# Patient Record
Sex: Male | Born: 1950 | Race: White | Hispanic: No | Marital: Married | Smoking: Never smoker
Health system: Southern US, Community
[De-identification: ages and names within clinical notes are randomized; demographics above are authoritative.]

## PROBLEM LIST (undated history)

## (undated) DIAGNOSIS — Z21 Asymptomatic human immunodeficiency virus [HIV] infection status: Secondary | ICD-10-CM

## (undated) DIAGNOSIS — N179 Acute kidney failure, unspecified: Secondary | ICD-10-CM

## (undated) DIAGNOSIS — M25561 Pain in right knee: Secondary | ICD-10-CM

## (undated) DIAGNOSIS — F329 Major depressive disorder, single episode, unspecified: Secondary | ICD-10-CM

## (undated) DIAGNOSIS — I10 Essential (primary) hypertension: Secondary | ICD-10-CM

## (undated) DIAGNOSIS — H9190 Unspecified hearing loss, unspecified ear: Secondary | ICD-10-CM

## (undated) DIAGNOSIS — M25511 Pain in right shoulder: Secondary | ICD-10-CM

## (undated) DIAGNOSIS — R509 Fever, unspecified: Principal | ICD-10-CM

## (undated) DIAGNOSIS — F32A Depression, unspecified: Secondary | ICD-10-CM

## (undated) DIAGNOSIS — M25552 Pain in left hip: Secondary | ICD-10-CM

## (undated) DIAGNOSIS — M25551 Pain in right hip: Secondary | ICD-10-CM

## (undated) DIAGNOSIS — B20 Human immunodeficiency virus [HIV] disease: Secondary | ICD-10-CM

## (undated) DIAGNOSIS — G5793 Unspecified mononeuropathy of bilateral lower limbs: Secondary | ICD-10-CM

## (undated) DIAGNOSIS — M25512 Pain in left shoulder: Secondary | ICD-10-CM

## (undated) DIAGNOSIS — R200 Anesthesia of skin: Secondary | ICD-10-CM

## (undated) DIAGNOSIS — R202 Paresthesia of skin: Secondary | ICD-10-CM

## (undated) HISTORY — DX: Human immunodeficiency virus (HIV) disease: B20

## (undated) HISTORY — DX: Pain in left hip: M25.552

## (undated) HISTORY — DX: Anesthesia of skin: R20.0

## (undated) HISTORY — DX: Pain in right hip: M25.551

## (undated) HISTORY — DX: Unspecified mononeuropathy of bilateral lower limbs: G57.93

## (undated) HISTORY — DX: Pain in right knee: M25.561

## (undated) HISTORY — DX: Unspecified hearing loss, unspecified ear: H91.90

## (undated) HISTORY — DX: Fever, unspecified: R50.9

## (undated) HISTORY — DX: Paresthesia of skin: R20.2

## (undated) HISTORY — DX: Pain in left shoulder: M25.512

## (undated) HISTORY — DX: Essential (primary) hypertension: I10

## (undated) HISTORY — DX: Pain in right shoulder: M25.511

## (undated) HISTORY — DX: Asymptomatic human immunodeficiency virus (hiv) infection status: Z21

---

## 2005-12-06 ENCOUNTER — Encounter (INDEPENDENT_AMBULATORY_CARE_PROVIDER_SITE_OTHER): Payer: Self-pay | Admitting: *Deleted

## 2005-12-06 LAB — CONVERTED CEMR LAB
CD4 Count: 205 microliters
CD4 T Cell Abs: 205

## 2005-12-21 ENCOUNTER — Encounter (INDEPENDENT_AMBULATORY_CARE_PROVIDER_SITE_OTHER): Payer: Self-pay | Admitting: *Deleted

## 2005-12-21 ENCOUNTER — Ambulatory Visit: Payer: Self-pay | Admitting: Infectious Diseases

## 2005-12-21 ENCOUNTER — Encounter: Admission: RE | Admit: 2005-12-21 | Discharge: 2005-12-21 | Payer: Self-pay | Admitting: Infectious Diseases

## 2006-01-05 ENCOUNTER — Ambulatory Visit: Payer: Self-pay | Admitting: Infectious Diseases

## 2006-01-19 ENCOUNTER — Ambulatory Visit: Payer: Self-pay | Admitting: Infectious Diseases

## 2006-01-27 ENCOUNTER — Ambulatory Visit: Payer: Self-pay | Admitting: Infectious Diseases

## 2006-02-17 ENCOUNTER — Ambulatory Visit: Payer: Self-pay | Admitting: Infectious Diseases

## 2006-03-11 ENCOUNTER — Ambulatory Visit: Payer: Self-pay | Admitting: Internal Medicine

## 2006-03-22 ENCOUNTER — Ambulatory Visit: Payer: Self-pay | Admitting: Infectious Diseases

## 2006-04-26 ENCOUNTER — Ambulatory Visit: Payer: Self-pay | Admitting: Infectious Diseases

## 2006-05-18 ENCOUNTER — Encounter (INDEPENDENT_AMBULATORY_CARE_PROVIDER_SITE_OTHER): Payer: Self-pay | Admitting: *Deleted

## 2006-05-18 ENCOUNTER — Ambulatory Visit: Payer: Self-pay | Admitting: Infectious Diseases

## 2006-05-18 LAB — CONVERTED CEMR LAB: CD4 Count: 284 microliters

## 2006-06-07 ENCOUNTER — Encounter (INDEPENDENT_AMBULATORY_CARE_PROVIDER_SITE_OTHER): Payer: Self-pay | Admitting: *Deleted

## 2006-06-07 ENCOUNTER — Ambulatory Visit: Payer: Self-pay | Admitting: Infectious Diseases

## 2006-06-07 LAB — CONVERTED CEMR LAB: HIV 1 RNA Quant: 220 copies/mL

## 2006-07-11 ENCOUNTER — Ambulatory Visit: Payer: Self-pay | Admitting: Infectious Diseases

## 2006-07-11 ENCOUNTER — Encounter (INDEPENDENT_AMBULATORY_CARE_PROVIDER_SITE_OTHER): Payer: Self-pay | Admitting: *Deleted

## 2006-07-11 LAB — CONVERTED CEMR LAB
AST: 17 units/L (ref 0–37)
Alkaline Phosphatase: 65 units/L (ref 39–117)
BUN: 13 mg/dL (ref 6–23)
Bilirubin Urine: NEGATIVE
Bilirubin, Direct: 0.1 mg/dL (ref 0.0–0.3)
CD4 Count: 300 microliters
CO2: 22 meq/L (ref 19–32)
Chloride: 105 meq/L (ref 96–112)
Creatinine, Ser: 0.94 mg/dL (ref 0.40–1.50)
Glucose, Bld: 131 mg/dL — ABNORMAL HIGH (ref 70–99)
Indirect Bilirubin: 0.5 mg/dL (ref 0.0–0.9)
Ketones, ur: NEGATIVE mg/dL
Specific Gravity, Urine: 1.018 (ref 1.005–1.03)
Total Bilirubin: 0.6 mg/dL (ref 0.3–1.2)
Triglycerides: 474 mg/dL — ABNORMAL HIGH (ref <150)
Urine Glucose: NEGATIVE mg/dL
pH: 5.5 (ref 5.0–8.0)

## 2006-10-05 ENCOUNTER — Ambulatory Visit: Payer: Self-pay | Admitting: Infectious Diseases

## 2006-10-05 LAB — CONVERTED CEMR LAB
BUN: 13 mg/dL (ref 6–23)
Bilirubin, Direct: 0.1 mg/dL (ref 0.0–0.3)
CD4 Count: 416 microliters
Chloride: 106 meq/L (ref 96–112)
Direct LDL: 84 mg/dL
Glucose, Bld: 131 mg/dL — ABNORMAL HIGH (ref 70–99)
HIV 1 RNA Quant: 57 copies/mL
Indirect Bilirubin: 0.5 mg/dL (ref 0.0–0.9)
Potassium: 3.8 meq/L (ref 3.5–5.3)
Sodium: 138 meq/L (ref 135–145)
Total CHOL/HDL Ratio: 8.1
Total Protein: 8 g/dL (ref 6.0–8.3)

## 2006-10-08 DIAGNOSIS — B20 Human immunodeficiency virus [HIV] disease: Secondary | ICD-10-CM | POA: Insufficient documentation

## 2006-10-08 DIAGNOSIS — Z8619 Personal history of other infectious and parasitic diseases: Secondary | ICD-10-CM | POA: Insufficient documentation

## 2006-10-17 ENCOUNTER — Encounter (INDEPENDENT_AMBULATORY_CARE_PROVIDER_SITE_OTHER): Payer: Self-pay | Admitting: *Deleted

## 2006-10-17 LAB — CONVERTED CEMR LAB

## 2006-10-27 ENCOUNTER — Ambulatory Visit: Payer: Self-pay | Admitting: Infectious Diseases

## 2006-10-30 ENCOUNTER — Encounter (INDEPENDENT_AMBULATORY_CARE_PROVIDER_SITE_OTHER): Payer: Self-pay | Admitting: *Deleted

## 2007-01-04 ENCOUNTER — Ambulatory Visit: Payer: Self-pay | Admitting: Infectious Diseases

## 2007-01-04 LAB — CONVERTED CEMR LAB
ALT: 30 U/L
AST: 21 U/L
Albumin: 4.8 g/dL
Alkaline Phosphatase: 71 U/L
BUN: 14 mg/dL
Bilirubin Urine: NEGATIVE
Bilirubin, Direct: 0.1 mg/dL
CO2: 25 meq/L
Calcium: 9.4 mg/dL
Chloride: 103 meq/L
Cholesterol: 177 mg/dL
Creatinine, Ser: 1.1 mg/dL
Creatinine, Urine: 196.8 mg/dL
Direct LDL: 92 mg/dL
Glucose, Bld: 104 mg/dL — ABNORMAL HIGH
HDL: 28 mg/dL — ABNORMAL LOW
HIV 1 RNA Quant: 49 copies/mL
Hemoglobin, Urine: NEGATIVE
Indirect Bilirubin: 0.5 mg/dL
Ketones, ur: NEGATIVE mg/dL
Leukocytes, UA: NEGATIVE
Nitrite: NEGATIVE
Potassium: 4 meq/L
Protein, ur: NEGATIVE mg/dL
Sodium: 139 meq/L
Specific Gravity, Urine: 1.024
Total Bilirubin: 0.6 mg/dL
Total CHOL/HDL Ratio: 6.3
Total Protein, Urine: 24
Total Protein: 8 g/dL
Triglycerides: 533 mg/dL — ABNORMAL HIGH
Urine Glucose: NEGATIVE mg/dL
Urobilinogen, UA: 0.2
pH: 5.5

## 2007-01-30 ENCOUNTER — Telehealth (INDEPENDENT_AMBULATORY_CARE_PROVIDER_SITE_OTHER): Payer: Self-pay | Admitting: Infectious Diseases

## 2007-02-01 ENCOUNTER — Encounter (INDEPENDENT_AMBULATORY_CARE_PROVIDER_SITE_OTHER): Payer: Self-pay | Admitting: Infectious Diseases

## 2007-03-22 ENCOUNTER — Ambulatory Visit: Payer: Self-pay | Admitting: Infectious Diseases

## 2007-03-22 LAB — CONVERTED CEMR LAB
ALT: 26 units/L (ref 0–53)
AST: 19 units/L (ref 0–37)
Bilirubin, Direct: 0.1 mg/dL (ref 0.0–0.3)
Calcium: 9.5 mg/dL (ref 8.4–10.5)
Cholesterol: 156 mg/dL (ref 0–200)
Direct LDL: 94 mg/dL
Glucose, Bld: 83 mg/dL (ref 70–99)
Indirect Bilirubin: 0.5 mg/dL (ref 0.0–0.9)
Potassium: 3.9 meq/L (ref 3.5–5.3)
Sodium: 139 meq/L (ref 135–145)
Total CHOL/HDL Ratio: 7.1
Total Protein: 7.5 g/dL (ref 6.0–8.3)
Triglycerides: 552 mg/dL — ABNORMAL HIGH (ref <150)

## 2007-06-14 ENCOUNTER — Ambulatory Visit: Payer: Self-pay | Admitting: Infectious Diseases

## 2007-06-14 ENCOUNTER — Encounter: Payer: Self-pay | Admitting: Infectious Diseases

## 2007-06-14 LAB — CONVERTED CEMR LAB
Alkaline Phosphatase: 66 units/L (ref 39–117)
BUN: 12 mg/dL (ref 6–23)
Bilirubin, Direct: 0.1 mg/dL (ref 0.0–0.3)
CD4 Count: 498 microliters
Chloride: 104 meq/L (ref 96–112)
Creatinine, Ser: 1.04 mg/dL (ref 0.40–1.50)
Direct LDL: 73 mg/dL
Glucose, Bld: 119 mg/dL — ABNORMAL HIGH (ref 70–99)
HIV 1 RNA Quant: 49 copies/mL
Indirect Bilirubin: 0.6 mg/dL (ref 0.0–0.9)
Leukocytes, UA: NEGATIVE
Potassium: 4.4 meq/L (ref 3.5–5.3)
Protein, ur: NEGATIVE mg/dL
Specific Gravity, Urine: 1.019 (ref 1.005–1.03)
Urine Glucose: NEGATIVE mg/dL
pH: 5 (ref 5.0–8.0)

## 2007-09-06 ENCOUNTER — Ambulatory Visit: Payer: Self-pay | Admitting: Infectious Diseases

## 2007-09-06 LAB — CONVERTED CEMR LAB
ALT: 35 units/L (ref 0–53)
AST: 24 units/L (ref 0–37)
Bilirubin, Direct: 0.1 mg/dL (ref 0.0–0.3)
CD4 Count: 419 uL
Calcium: 9.6 mg/dL (ref 8.4–10.5)
Cholesterol: 167 mg/dL (ref 0–200)
Direct LDL: 85 mg/dL
Glucose, Bld: 167 mg/dL — ABNORMAL HIGH (ref 70–99)
HIV 1 RNA Quant: 49 {copies}/mL
Sodium: 142 meq/L (ref 135–145)
Total CHOL/HDL Ratio: 7
Triglycerides: 493 mg/dL — ABNORMAL HIGH (ref <150)

## 2007-12-05 ENCOUNTER — Ambulatory Visit: Payer: Self-pay | Admitting: Infectious Diseases

## 2007-12-05 LAB — CONVERTED CEMR LAB
BUN: 17 mg/dL (ref 6–23)
Bilirubin Urine: NEGATIVE
Bilirubin, Direct: 0.1 mg/dL (ref 0.0–0.3)
Chloride: 104 meq/L (ref 96–112)
Cholesterol: 181 mg/dL (ref 0–200)
Creatinine, Urine: 128.5 mg/dL
Glucose, Bld: 101 mg/dL — ABNORMAL HIGH (ref 70–99)
HCV Ab: NEGATIVE
Hep A Total Ab: NEGATIVE
Hep B Core Total Ab: POSITIVE — AB
Hepatitis B Surface Ag: NEGATIVE
Indirect Bilirubin: 0.4 mg/dL (ref 0.0–0.9)
Ketones, ur: NEGATIVE mg/dL
LDL Cholesterol: 88 mg/dL (ref 0–99)
Potassium: 4.1 meq/L (ref 3.5–5.3)
Protein, ur: NEGATIVE mg/dL
Urobilinogen, UA: 0.2 (ref 0.0–1.0)
VLDL: 56 mg/dL — ABNORMAL HIGH (ref 0–40)

## 2008-02-20 ENCOUNTER — Encounter: Payer: Self-pay | Admitting: Infectious Diseases

## 2008-02-20 ENCOUNTER — Ambulatory Visit: Payer: Self-pay | Admitting: Internal Medicine

## 2008-02-20 LAB — CONVERTED CEMR LAB
ALT: 25 units/L (ref 0–53)
AST: 16 units/L (ref 0–37)
Albumin: 4.8 g/dL (ref 3.5–5.2)
Alkaline Phosphatase: 59 units/L (ref 39–117)
BUN: 13 mg/dL (ref 6–23)
Bilirubin, Direct: 0.1 mg/dL (ref 0.0–0.3)
CD4 Count: 415 microliters
CO2: 25 meq/L (ref 19–32)
Calcium: 9.7 mg/dL (ref 8.4–10.5)
Chloride: 105 meq/L (ref 96–112)
Cholesterol: 152 mg/dL (ref 0–200)
Creatinine, Ser: 1.03 mg/dL (ref 0.40–1.50)
Glucose, Bld: 105 mg/dL — ABNORMAL HIGH (ref 70–99)
HDL: 25 mg/dL — ABNORMAL LOW (ref >39)
Indirect Bilirubin: 0.4 mg/dL (ref 0.0–0.9)
LDL Cholesterol: 56 mg/dL (ref 0–99)
Potassium: 4.3 meq/L (ref 3.5–5.3)
Sodium: 142 meq/L (ref 135–145)
Total Bilirubin: 0.5 mg/dL (ref 0.3–1.2)
Total CHOL/HDL Ratio: 6.1
Total Protein: 7.9 g/dL (ref 6.0–8.3)
Triglycerides: 353 mg/dL — ABNORMAL HIGH (ref <150)
VLDL: 71 mg/dL — ABNORMAL HIGH (ref 0–40)

## 2008-05-10 ENCOUNTER — Encounter: Payer: Self-pay | Admitting: Infectious Diseases

## 2008-05-10 ENCOUNTER — Ambulatory Visit: Payer: Self-pay | Admitting: Infectious Disease

## 2008-05-10 LAB — CONVERTED CEMR LAB
ALT: 23 units/L (ref 0–53)
AST: 17 units/L (ref 0–37)
Albumin: 4.7 g/dL (ref 3.5–5.2)
Bilirubin, Direct: 0.1 mg/dL (ref 0.0–0.3)
Cholesterol: 167 mg/dL (ref 0–200)
Glucose, Bld: 86 mg/dL (ref 70–99)
HDL: 26 mg/dL — ABNORMAL LOW (ref >39)
HIV 1 RNA Quant: 49 copies/mL
Hemoglobin, Urine: NEGATIVE
Ketones, ur: NEGATIVE mg/dL
Leukocytes, UA: NEGATIVE
Nitrite: NEGATIVE
Phosphorus: 3.4 mg/dL (ref 2.3–4.6)
Potassium: 3.7 meq/L (ref 3.5–5.3)
Protein, ur: 30 mg/dL — AB
Sodium: 142 meq/L (ref 135–145)
Total CHOL/HDL Ratio: 6.4
Total Protein: 7.2 g/dL (ref 6.0–8.3)
Triglycerides: 433 mg/dL — ABNORMAL HIGH (ref <150)
Urobilinogen, UA: 0.2 (ref 0.0–1.0)

## 2008-06-04 ENCOUNTER — Encounter: Payer: Self-pay | Admitting: Infectious Diseases

## 2008-09-04 ENCOUNTER — Ambulatory Visit: Payer: Self-pay | Admitting: Infectious Diseases

## 2008-09-04 ENCOUNTER — Encounter: Payer: Self-pay | Admitting: Infectious Disease

## 2008-09-04 LAB — CONVERTED CEMR LAB
ALT: 43 units/L (ref 0–53)
Albumin: 4.7 g/dL (ref 3.5–5.2)
CD4 Count: 439 microliters
CO2: 20 meq/L (ref 19–32)
Cholesterol: 175 mg/dL (ref 0–200)
Glucose, Bld: 117 mg/dL — ABNORMAL HIGH (ref 70–99)
LDL Cholesterol: 73 mg/dL (ref 0–99)
Potassium: 3.8 meq/L (ref 3.5–5.3)
Sodium: 142 meq/L (ref 135–145)
Total Protein, Urine: 26
Total Protein: 7.8 g/dL (ref 6.0–8.3)
Triglycerides: 361 mg/dL — ABNORMAL HIGH (ref <150)
VLDL: 72 mg/dL — ABNORMAL HIGH (ref 0–40)

## 2008-09-19 ENCOUNTER — Ambulatory Visit: Payer: Self-pay | Admitting: Infectious Disease

## 2008-09-19 LAB — CONVERTED CEMR LAB
Chlamydia, Swab/Urine, PCR: NEGATIVE
GC Probe Amp, Urine: NEGATIVE

## 2008-12-30 ENCOUNTER — Ambulatory Visit: Payer: Self-pay | Admitting: Infectious Disease

## 2008-12-30 LAB — CONVERTED CEMR LAB
Albumin: 5.1 g/dL (ref 3.5–5.2)
Alkaline Phosphatase: 75 units/L (ref 39–117)
BUN: 12 mg/dL (ref 6–23)
Basophils Absolute: 0 10*3/uL (ref 0.0–0.1)
CO2: 24 meq/L (ref 19–32)
Calcium: 9.6 mg/dL (ref 8.4–10.5)
Cholesterol, target level: 200 mg/dL
Cholesterol: 131 mg/dL (ref 0–200)
Eosinophils Relative: 1 % (ref 0–5)
GFR calc Af Amer: >60 mL/min (ref >60)
GFR calc non Af Amer: >60 mL/min (ref >60)
Glucose, Bld: 104 mg/dL — ABNORMAL HIGH (ref 70–99)
HDL: 24 mg/dL — ABNORMAL LOW (ref >39)
HIV-1 RNA Quant, Log: <1.68 (ref <1.68)
LDL Cholesterol: 65 mg/dL (ref 0–99)
Lymphocytes Relative: 22 % (ref 12–46)
Lymphs Abs: 1.2 10*3/uL (ref 0.7–4.0)
Neutro Abs: 3.7 10*3/uL (ref 1.7–7.7)
Neutrophils Relative %: 67 % (ref 43–77)
Platelets: 187 10*3/uL (ref 150–400)
Potassium: 4.4 meq/L (ref 3.5–5.3)
RDW: 13.1 % (ref 11.5–15.5)
Sodium: 140 meq/L (ref 135–145)
Total Protein: 8 g/dL (ref 6.0–8.3)
Triglycerides: 209 mg/dL — ABNORMAL HIGH (ref <150)
WBC: 5.5 10*3/uL (ref 4.0–10.5)

## 2009-02-04 ENCOUNTER — Ambulatory Visit: Payer: Self-pay | Admitting: Infectious Disease

## 2009-02-04 LAB — CONVERTED CEMR LAB
ALT: 61 units/L — ABNORMAL HIGH (ref 0–53)
Albumin: 4.6 g/dL (ref 3.5–5.2)
CD4 Count: 375 microliters
CO2: 24 meq/L (ref 19–32)
GFR calc Af Amer: >60 mL/min (ref >60)
HDL: 23 mg/dL — ABNORMAL LOW (ref >39)
HIV 1 RNA Quant: 39 copies/mL
LDL Cholesterol: 48 mg/dL (ref 0–99)
Potassium: 4.7 meq/L (ref 3.5–5.3)
Sodium: 140 meq/L (ref 135–145)
Total Bilirubin: 0.5 mg/dL (ref 0.3–1.2)
Total CHOL/HDL Ratio: 5.5
Total Protein: 7.4 g/dL (ref 6.0–8.3)

## 2009-06-10 ENCOUNTER — Ambulatory Visit: Payer: Self-pay | Admitting: Infectious Disease

## 2009-06-10 LAB — CONVERTED CEMR LAB
ALT: 75 units/L — ABNORMAL HIGH (ref 0–53)
AST: 40 units/L — ABNORMAL HIGH (ref 0–37)
Alkaline Phosphatase: 67 units/L (ref 39–117)
Basophils Absolute: 0 10*3/uL (ref 0.0–0.1)
Basophils Relative: 0 % (ref 0–1)
LDL Cholesterol: 68 mg/dL (ref 0–99)
MCHC: 32.9 g/dL (ref 30.0–36.0)
Neutro Abs: 3.4 10*3/uL (ref 1.7–7.7)
Neutrophils Relative %: 63 % (ref 43–77)
RBC: 5.17 M/uL (ref 4.22–5.81)
RDW: 13.9 % (ref 11.5–15.5)
Sodium: 142 meq/L (ref 135–145)
Total Bilirubin: 0.7 mg/dL (ref 0.3–1.2)
Total Protein: 7.6 g/dL (ref 6.0–8.3)
Triglycerides: 144 mg/dL (ref <150)
VLDL: 29 mg/dL (ref 0–40)

## 2009-09-24 ENCOUNTER — Ambulatory Visit: Payer: Self-pay | Admitting: Infectious Disease

## 2009-09-24 LAB — CONVERTED CEMR LAB
ALT: 68 units/L — ABNORMAL HIGH (ref 0–53)
AST: 36 units/L (ref 0–37)
Calcium: 9.2 mg/dL (ref 8.4–10.5)
Chloride: 103 meq/L (ref 96–112)
Creatinine, Ser: 1.05 mg/dL (ref 0.40–1.50)
Creatinine, Urine: 155.4 mg/dL
Hep B Core Total Ab: POSITIVE — AB
Hepatitis B Surface Ag: NEGATIVE
Potassium: 4.3 meq/L (ref 3.5–5.3)
Total CHOL/HDL Ratio: 4.4

## 2009-10-11 ENCOUNTER — Encounter: Payer: Self-pay | Admitting: Infectious Disease

## 2010-01-06 ENCOUNTER — Ambulatory Visit: Payer: Self-pay | Admitting: Infectious Disease

## 2010-01-06 LAB — CONVERTED CEMR LAB
Cholesterol: 111 mg/dL (ref 0–200)
Total CHOL/HDL Ratio: 4.6
Triglycerides: 175 mg/dL — ABNORMAL HIGH (ref <150)
VLDL: 35 mg/dL (ref 0–40)

## 2010-01-07 ENCOUNTER — Encounter: Payer: Self-pay | Admitting: Infectious Disease

## 2010-01-07 LAB — CONVERTED CEMR LAB
AST: 36 units/L (ref 0–37)
Alkaline Phosphatase: 67 units/L (ref 39–117)
BUN: 12 mg/dL (ref 6–23)
Creatinine, Ser: 1.01 mg/dL (ref 0.40–1.50)
Potassium: 4 meq/L (ref 3.5–5.3)
Total Bilirubin: 0.5 mg/dL (ref 0.3–1.2)

## 2010-03-11 ENCOUNTER — Telehealth (INDEPENDENT_AMBULATORY_CARE_PROVIDER_SITE_OTHER): Payer: Self-pay | Admitting: *Deleted

## 2010-05-12 ENCOUNTER — Ambulatory Visit: Payer: Self-pay | Admitting: Infectious Disease

## 2010-05-13 ENCOUNTER — Encounter: Payer: Self-pay | Admitting: Infectious Disease

## 2010-05-13 LAB — CONVERTED CEMR LAB
Albumin: 4.4 g/dL (ref 3.5–5.2)
Alkaline Phosphatase: 60 units/L (ref 39–117)
BUN: 16 mg/dL (ref 6–23)
CO2: 23 meq/L (ref 19–32)
Cholesterol: 125 mg/dL (ref 0–200)
Eosinophils Absolute: 0.1 10*3/uL (ref 0.0–0.7)
Eosinophils Relative: 2 % (ref 0–5)
Glucose, Bld: 107 mg/dL — ABNORMAL HIGH (ref 70–99)
HCT: 47.1 % (ref 39.0–52.0)
HDL: 22 mg/dL — ABNORMAL LOW (ref >39)
Hemoglobin: 15.4 g/dL (ref 13.0–17.0)
LDL Cholesterol: 69 mg/dL (ref 0–99)
Lymphs Abs: 1.5 10*3/uL (ref 0.7–4.0)
MCV: 98.1 fL (ref 78.0–100.0)
Monocytes Relative: 7 % (ref 3–12)
Potassium: 4.3 meq/L (ref 3.5–5.3)
RBC: 4.8 M/uL (ref 4.22–5.81)
Total Bilirubin: 0.8 mg/dL (ref 0.3–1.2)
Triglycerides: 168 mg/dL — ABNORMAL HIGH (ref <150)
WBC: 5.6 10*3/uL (ref 4.0–10.5)

## 2010-07-21 ENCOUNTER — Encounter (INDEPENDENT_AMBULATORY_CARE_PROVIDER_SITE_OTHER): Payer: Self-pay | Admitting: *Deleted

## 2010-08-05 ENCOUNTER — Ambulatory Visit: Payer: Self-pay | Admitting: Infectious Disease

## 2010-08-05 ENCOUNTER — Encounter: Payer: Self-pay | Admitting: Infectious Disease

## 2010-08-05 LAB — CONVERTED CEMR LAB
Chlamydia, Swab/Urine, PCR: NEGATIVE
GC Probe Amp, Urine: NEGATIVE

## 2010-09-15 ENCOUNTER — Ambulatory Visit: Admit: 2010-09-15 | Payer: Self-pay | Admitting: Infectious Disease

## 2010-09-16 ENCOUNTER — Encounter (INDEPENDENT_AMBULATORY_CARE_PROVIDER_SITE_OTHER): Payer: Self-pay | Admitting: *Deleted

## 2010-09-22 NOTE — Assessment & Plan Note (Signed)
Summary: STUDY APPT/ LH    Current Allergies: No known allergies  Vital Signs:  Patient profile:   60 year old male Weight:      197.3 pounds BMI:     30.11 Temp:     98.4 degrees F oral Pulse rate:   96 / minute BP sitting:   153 / 96  (right arm) Is Patient Diabetic? No Pain Assessment Patient in pain? no      Nutritional Status BMI of > 30 = obese  Does patient need assistance? Functional Status Self care Ambulation Normal    Patient here for week 208 ALLRT study visit. He denies any new problems. He says he has been remembering his dreams more and wondered if that had anything to do with his meds. Also, He is concerned about weight gain. BP elevated. He refuses to start BP meds for now, though.Deirdre Evener RN  Jan 06, 2010 11:59 AM   Other Orders: Est. Patient Research Study [phone]) T-Comprehensive Metabolic Panel [id number]) T-Lipid Profile [id number])  Process Orders Check Orders Results:     [facility]: ABN not required for this insurance Tests Sent for requisitioning (Jan 06, 2010 11:57 AM):     01/06/2010: [facility] -- T-Comprehensive Metabolic Panel [[id number]] (signed)     01/06/2010: [facility] -- T-Lipid Profile [id number] (signed)

## 2010-09-22 NOTE — Assessment & Plan Note (Signed)
Summary: STUDY APPT/ LH    Current Allergies: No known allergies  Social History: Tobacco Use:  yes  Vital Signs:  Patient profile:   60 year old male Height:      68 inches (172.72 cm) Weight:      190.2 pounds (86.45 kg) BMI:     29.02 Temp:     97.9 degrees F oral Pulse rate:   88 / minute BP sitting:   150 / 88  (right arm) Is Patient Diabetic? No Research Study Name: ALLRT Pain Assessment Patient in pain? no      Nutritional Status BMI of 25 - 29 = overweight  Does patient need assistance? Functional Status Self care Ambulation Normal   Patient here for week 192 ALLRT study visit. He does not have any new complaints. He recently traveled to [place] for training for a new job. He has never started the lisinopril for BP and his BP was a little elevated today. He says that it is usually lower when checked outside of the clinic. He was instructed to schedule an appt with Dr. Daiva Eves soon.Deirdre Evener RN  September 24, 2009 9:39 AM    Complete Medication List: 1)  Isentress 400 Mg Tabs (Raltegravir potassium) .... Take 1 tablet by mouth two times a day 2)  Truvada 200-300 Mg Tabs (Emtricitabine-tenofovir) .... Take 1 tablet by mouth once a day 3)  Lisinopril 20 Mg Tabs (Lisinopril) .... Take 1 tablet by mouth once a day  Other Orders: Est. Patient Research Study [phone] [id number]) T-Comprehensive Metabolic Panel [id number]) T-Lipid Profile [id number]) T-Urine Protein [id number]) T-Urine Creatinine [id number]) T-Hepatitis A Antibody ([id number]) T-Hepatitis C Antibody ([id number]) T-Hepatitis B Surface Antigen ([id number]) T-Hepatitis B Core Antibody ([id number]) Process Orders Check Orders Results:     [facility]: ABN not required for this insurance Tests Sent for requisitioning (September 25, 2009 9:57 AM):     09/24/2009: [facility] -- T-Comprehensive Metabolic Panel [[id number]] (signed)     09/24/2009: [facility] -- T-Lipid Profile [phone] (signed)     09/24/2009: [facility] -- T-Urine Protein [id number] (signed)     09/24/2009: [facility] -- T-Urine Creatinine [[id number]] (signed)     09/24/2009: [facility] -- T-Hepatitis A Antibody [[id number]] (signed)     09/24/2009: [facility] -- T-Hepatitis C Antibody [[id number]] (signed)     09/24/2009: [facility] -- T-Hepatitis B Surface Antigen [[id number]] (signed)     09/24/2009: [facility] -- T-Hepatitis B Core Antibody [[id number]] (signed)

## 2010-09-22 NOTE — Assessment & Plan Note (Signed)
Summary: FLU SHOT  Prior Medications: ISENTRESS 400 MG TABS (RALTEGRAVIR POTASSIUM) Take 1 tablet by mouth two times a day TRUVADA 200-300 MG TABS (EMTRICITABINE-TENOFOVIR) Take 1 tablet by mouth once a day LISINOPRIL 20 MG TABS (LISINOPRIL) Take 1 tablet by mouth once a day Current Allergies: No known allergies  Immunizations Administered:  Influenza Vaccine # 1:    Vaccine Type: Fluvax Non-MCR    Site: left deltoid    Mfr: novartis    Dose: 0.5 ml    Route: IM    Given by: Deirdre Evener RN    Exp. Date: 12/06/2010    Lot #: [id number]    VIS given: 03/17/10 version given May 12, 2010.  Flu Vaccine Consent Questions:    Do you have a history of severe allergic reactions to this vaccine? no    Any prior history of allergic reactions to egg and/or gelatin? no    Do you have a sensitivity to the preservative Thimersol? no    Do you have a past history of Guillan-Barre Syndrome? no    Do you currently have an acute febrile illness? no    Have you ever had a severe reaction to latex? no    Vaccine information given and explained to patient? yes  Orders Added: 1)  Influenza Vaccine NON MCR [00028]

## 2010-09-22 NOTE — Miscellaneous (Signed)
Summary: HIV-1 RNA, CD4 (RESEARCH)  Clinical Lists Changes  Observations: Added new observation of CD4 COUNT: 454 microliters (09/24/2009 8:23) Added new observation of HIV1RNA QA: 39 copies/mL (09/24/2009 8:23)

## 2010-09-22 NOTE — Miscellaneous (Signed)
Summary: RW Update  Clinical Lists Changes  Observations: Added new observation of YEARAIDSPOS: 2010  (10/11/2009 10:30) Added new observation of HIV STATUS: CDC-defined AIDS  (10/11/2009 10:30)

## 2010-09-22 NOTE — Miscellaneous (Signed)
Summary: HIV-1 RNA, CD4 (RESEARCH)  Clinical Lists Changes  Observations: Added new observation of CD4 COUNT: 453 microliters (01/06/2010 15:04) Added new observation of HIV1RNA QA: 39 copies/mL (01/06/2010 15:04)

## 2010-09-22 NOTE — Progress Notes (Signed)
Summary: new scripts/New pharmacy [facility]  Phone Note From Pharmacy   Caller: [facility] Details for Reason: Need new prescriptions Summary of Call: Patient has insurance and [facility] is calling for new scripts.  Their phone number for future refills is [phone] Initial call taken by: Paulo Fruit  BS,CPht II,MPH,  March 11, 2010 2:41 PM    Prescriptions: TRUVADA 200-300 MG TABS (EMTRICITABINE-TENOFOVIR) Take 1 tablet by mouth once a day  #90 x 3   Entered by:   Paulo Fruit  BS,CPht II,MPH   Authorized by:   Acey Lav MD   Signed by:   Paulo Fruit  BS,CPht II,MPH on 03/11/2010   Method used:   Telephoned to ...         RxID:   [id number] ISENTRESS 400 MG TABS (RALTEGRAVIR POTASSIUM) Take 1 tablet by mouth two times a day  #60 x 12   Entered by:   Paulo Fruit  BS,CPht II,MPH   Authorized by:   Acey Lav MD   Signed by:   Paulo Fruit  BS,CPht II,MPH on 03/11/2010   Method used:   Telephoned to ...         RxID:   [id number]  spoke to the pharmacist Morrie Sheldon who read back the order.[facility] [phone] Paulo Fruit  BS,CPht II,MPH  March 11, 2010 2:44 PM

## 2010-09-22 NOTE — Miscellaneous (Signed)
  Clinical Lists Changes  Observations: Added new observation of YEARAIDSPOS: 2007  (07/21/2010 11:29)

## 2010-09-24 NOTE — Assessment & Plan Note (Signed)
Summary: F/U [MKJ]   Visit Type:  Follow-up Primary Provider:  Paulette Blanch Dam MD  CC:  f/u ov   No missed doses of HART , Hypertension Management, and Lipid Management.  History of Present Illness: 60 year old patient previously in ACTG on epzicom and boosted lexiva with suppressed viral load and t cell count in 300-400 range whom I changed to raltegravir and truvada.  He has remained supressed on this regimen and is being followed by Selena Batten in ALLERT ACTG study. He suffered another bout of bronchitis which is improving on cipro rx by his PCP. He is nonsmoker and has bout approximately once a year. Never tried bronchodilators. He was exposed to passive smoke from [place]. He has elevated BP today but no ssx. We discussed anti HTNsive meds and he was actually not taking his ACEI. He has better bps at his PCP but still in the 140s.  Hypertension History:      Positive major cardiovascular risk factors include male age 38 years old or older, hyperlipidemia, and hypertension.  Negative major cardiovascular risk factors include negative family history for ischemic heart disease and non-tobacco-user status.        Further assessment for target organ damage reveals no history of ASHD, stroke/TIA, or peripheral vascular disease.    Lipid Management History:      Positive NCEP/ATP III risk factors include male age 3 years old or older, HDL cholesterol less than 40, and hypertension.  Negative NCEP/ATP III risk factors include no family history for ischemic heart disease, non-tobacco-user status, no ASHD (atherosclerotic heart disease), no prior stroke/TIA, no peripheral vascular disease, and no history of aortic aneurysm.     Problems Prior to Update: 1)  Cough  (ICD-786.2) 2)  Essential Hypertension, Benign  (ICD-401.1) 3)  Health Screening  (ICD-V70.0) 4)  Hypertriglyceridemia  (ICD-272.1) 5)  HIV Disease  (ICD-042) 6)  Hepatitis B, Hx of  (ICD-V12.09)  Medications Prior to Update: 1)  Isentress  400 Mg Tabs (Raltegravir Potassium) .... Take 1 Tablet By Mouth Two Times A Day 2)  Truvada 200-300 Mg Tabs (Emtricitabine-Tenofovir) .... Take 1 Tablet By Mouth Once A Day 3)  Lisinopril 20 Mg Tabs (Lisinopril) .... Take 1 Tablet By Mouth Once A Day  Current Medications (verified): 1)  Isentress 400 Mg Tabs (Raltegravir Potassium) .... Take 1 Tablet By Mouth Two Times A Day 2)  Truvada 200-300 Mg Tabs (Emtricitabine-Tenofovir) .... Take 1 Tablet By Mouth Once A Day 3)  Hydrochlorothiazide 12.5 Mg Caps (Hydrochlorothiazide) .... Take 1 Tablet By Mouth Once A Day  Allergies (verified): 1)  ! Ceclor    Preventive Screening-Counseling & Management  Alcohol-Tobacco     Alcohol drinks/day: 0     Smoking Status: never     Cans of tobacco/week: yes     Passive Smoke Exposure: no   Current Allergies (reviewed today): ! CECLOR Past History:  Past Medical History: Hepatitis B, hx of HIV disease Bronchitis seasonally  Past Surgical History: none  Family History: no early coronary artery disease, no colon cancer, Mom died of lung cancer at 75  Review of Systems       The patient complains of prolonged cough.  The patient denies anorexia, fever, weight loss, weight gain, vision loss, decreased hearing, hoarseness, chest pain, syncope, dyspnea on exertion, peripheral edema, headaches, hemoptysis, abdominal pain, melena, hematochezia, severe indigestion/heartburn, hematuria, incontinence, genital sores, muscle weakness, suspicious skin lesions, transient blindness, difficulty walking, depression, unusual weight change, abnormal bleeding, enlarged lymph nodes,  and angioedema.    Vital Signs:  Patient profile:   60 year old male Height:      68 inches Weight:      191.8 pounds BMI:     29.27 BSA:     2.01 Temp:     98.3 degrees F oral Pulse rate:   85 / minute BP sitting:   160 / 88  (right arm) CC: f/u ov   No missed doses of HART , Hypertension Management, Lipid Management Is  Patient Diabetic? No Pain Assessment Patient in pain? no      Nutritional Status BMI of 25 - 29 = overweight Nutritional Status Detail normal  Have you ever been in a relationship where you felt threatened, hurt or afraid?No  Domestic Violence Intervention none  Does patient need assistance? Functional Status Self care Ambulation Normal        Medication Adherence: 08/05/2010   Adherence to medications reviewed with patient. Counseling to provide adequate adherence provided   Prevention For Positives: 08/05/2010   Safe sex practices discussed with patient. Condoms offered.                             Physical Exam  General:  alert, well-developed, and well-nourished.   Head:  normocephalic and atraumatic.   Eyes:  vision grossly intact, pupils equal, pupils round, and pupils reactive to light.   Ears:  no external deformities.   Nose:  no external deformity.   Mouth:  pharynx pink and moist with slight posterior erythema Lungs:  normal respiratory effort, no intercostal retractions, no accessory muscle use, and normal breath sounds.   Heart:  normal rate, regular rhythm, no murmur, and no gallop.   Abdomen:  soft, non-tender, normal bowel sounds, and no distention.   Msk:  normal ROM.   Extremities:  trace left pedal edema and trace right pedal edema.   Neurologic:  alert & oriented X3.  gait normal.   Skin:  color normal and no rashes.   Psych:  Oriented X3, memory intact for recent and remote, and normally interactive.     Impression & Recommendations:  Problem # 1:  HIV DISEASE (ICD-042)  Superb control. seeing Selena Batten in January  for labs will rtc in one year Diagnostics Reviewed:  HIV: CDC-defined AIDS (10/11/2009)   CD4: 453 (01/06/2010)   CD4 %: 26 (10/05/2006) WBC: 5.6 (05/13/2010)   Hgb: 15.4 (05/13/2010)   HCT: 47.1 (05/13/2010)   Platelets: 158 (05/13/2010) HIV-1 RNA: 39 (01/06/2010)   HBSAg: NEG (09/24/2009)  Orders: Est. Patient Level IV  (04540)  Problem # 2:  COUGH (ICD-786.2)  bronchitis flare resolving, can trial beta agonist in future  Orders: Est. Patient Level IV (98119)  Problem # 3:  ESSENTIAL HYPERTENSION, BENIGN (ICD-401.1)  add hctz The following medications were removed from the medication list:    Lisinopril 20 Mg Tabs (Lisinopril) .Marland Kitchen... Take 1 tablet by mouth once a day His updated medication list for this problem includes:    Hydrochlorothiazide 12.5 Mg Caps (Hydrochlorothiazide) .Marland Kitchen... Take 1 tablet by mouth once a day  Orders: Est. Patient Level IV (14782)  Problem # 4:  HYPERTRIGLYCERIDEMIA (ICD-272.1)  TG fine with change in meds, his hdl still low he iws working on weight Labs Reviewed: SGOT: 41 (05/13/2010)   SGPT: 66 (05/13/2010)  Lipid Goals: Chol Goal: 200 (12/30/2008)   HDL Goal: 40 (12/30/2008)   LDL Goal: 130 (12/30/2008)   TG Goal: 150 (  12/30/2008)  10 Yr Risk Heart Disease: 14 % Prior 10 Yr Risk Heart Disease: 11 % (12/30/2008)   HDL:22 (05/13/2010), 24 (01/06/2010)  LDL:69 (05/13/2010), 52 (01/06/2010)  Chol:125 (05/13/2010), 111 (01/06/2010)  Trig:168 (05/13/2010), 175 (01/06/2010)  Orders: Est. Patient Level IV (16109)  Medications Added to Medication List This Visit: 1)  Hydrochlorothiazide 12.5 Mg Caps (Hydrochlorothiazide) .... Take 1 tablet by mouth once a day  Other Orders: T-GC Probe, urine [id number]) T-Chlamydia  Probe, urine [id number]) Future Orders: T-RPR (Syphilis) ([id number]) ... 08/24/2010  Hypertension Assessment/Plan:      The patient's hypertensive risk group is category B: At least one risk factor (excluding diabetes) with no target organ damage.  His calculated 10 year risk of coronary heart disease is 14 %.  Today's blood pressure is 160/88.  His blood pressure goal is < 140/90.  Lipid Assessment/Plan:      Based on NCEP/ATP III, the patient's risk factor category is "2 or more risk factors and a calculated 10 year CAD risk of < 20%".  The  patient's lipid goals are as follows: Total cholesterol goal is 200; LDL cholesterol goal is 130; HDL cholesterol goal is 40; Triglyceride goal is 150.  His LDL cholesterol goal has been met.     Patient Instructions: 1)  rtc to see Dr Daiva Eves in one year    Hepatitis A Vaccine # 2 (to be given today) Prescriptions: HYDROCHLOROTHIAZIDE 12.5 MG CAPS (HYDROCHLOROTHIAZIDE) Take 1 tablet by mouth once a day  #30 x 11   Entered and Authorized by:   Acey Lav MD   Signed by:   Paulette Blanch Dam MD on 08/05/2010   Method used:   Electronically to        [facility] (retail)       [address]       [place], [place]  [address]       Ph: [phone]       Fax: [phone]   RxID:   [id number]

## 2010-09-24 NOTE — Miscellaneous (Signed)
  Clinical Lists Changes  Observations: Added new observation of INCOMESOURCE: UNKNOWN (09/16/2010 13:08) Added new observation of HOUSEINCOME: 0  (09/16/2010 13:08) Added new observation of HOUSING: Unknown  (09/16/2010 13:08)

## 2010-09-25 ENCOUNTER — Encounter: Payer: Self-pay | Admitting: Infectious Disease

## 2010-09-25 ENCOUNTER — Ambulatory Visit: Payer: Self-pay

## 2010-09-25 DIAGNOSIS — B20 Human immunodeficiency virus [HIV] disease: Secondary | ICD-10-CM

## 2010-09-25 LAB — CONVERTED CEMR LAB
ALT: 67 units/L — ABNORMAL HIGH (ref 0–53)
AST: 39 units/L — ABNORMAL HIGH (ref 0–37)
Calcium: 9.9 mg/dL (ref 8.4–10.5)
Chloride: 99 meq/L (ref 96–112)
Creatinine, Ser: 1.11 mg/dL (ref 0.40–1.50)
Creatinine, Urine: 191.7 mg/dL
HIV 1 RNA Quant: 39 copies/mL
Sodium: 139 meq/L (ref 135–145)
Total Bilirubin: 0.7 mg/dL (ref 0.3–1.2)
Total CHOL/HDL Ratio: 5.1
Total Protein, Urine: 16
Total Protein: 7.8 g/dL (ref 6.0–8.3)
VLDL: 34 mg/dL (ref 0–40)

## 2010-09-30 NOTE — Assessment & Plan Note (Signed)
Summary: Research   Vital Signs:  Patient profile:   60 year old male Weight:      193.75 pounds (88.07 kg) BMI:     29.57 Temp:     97.8 degrees F oral Pulse rate:   73 / minute BP sitting:   145 / 88  (right arm) Is Patient Diabetic? No Pain Assessment Patient in pain? no      Nutritional Status BMI of 25 - 29 = overweight  Does patient need assistance? Functional Status Self care Ambulation Normal   Allergies: 1)  ! Ceclor  Patient here for week 240 ALLRT study visit. He denies any current complaints. He will return in June for his next visit.Deirdre Evener RN  September 25, 2010 9:01 AM    Other Orders: Est. Patient Research Study [id number]) T-Comprehensive Metabolic Panel [id number]) T-Lipid Profile [id number]) T-Urine Protein [phone]) T-Urine Creatinine [phone])   Orders Added: 1)  Est. Patient Research Study [04200] 2)  T-Comprehensive Metabolic Panel [[id number]] 3)  T-Lipid Profile [[id number]] 4)  T-Urine Protein [[id number]] 5)  T-Urine Creatinine [[id number]]

## 2010-10-14 NOTE — Miscellaneous (Signed)
Summary: HIV-1 RNA, CD4 (RESEARCH)  Clinical Lists Changes  Observations: Added new observation of CD4 COUNT: 537 microliters (09/25/2010 10:47) Added new observation of HIV1RNA QA: 39 copies/mL (09/25/2010 10:47)

## 2011-01-22 ENCOUNTER — Ambulatory Visit (INDEPENDENT_AMBULATORY_CARE_PROVIDER_SITE_OTHER): Payer: Self-pay | Admitting: *Deleted

## 2011-01-22 ENCOUNTER — Encounter: Payer: Self-pay | Admitting: *Deleted

## 2011-01-22 VITALS — BP 163/100 | HR 96 | Temp 98.1°F | Wt 189.8 lb

## 2011-01-22 DIAGNOSIS — B20 Human immunodeficiency virus [HIV] disease: Secondary | ICD-10-CM

## 2011-01-22 LAB — COMPREHENSIVE METABOLIC PANEL
AST: 47 U/L — ABNORMAL HIGH (ref 0–37)
Albumin: 4.9 g/dL (ref 3.5–5.2)
Alkaline Phosphatase: 64 U/L (ref 39–117)
BUN: 15 mg/dL (ref 6–23)
Creat: 1.11 mg/dL (ref 0.50–1.35)
Glucose, Bld: 116 mg/dL — ABNORMAL HIGH (ref 70–99)
Total Bilirubin: 0.7 mg/dL (ref 0.3–1.2)

## 2011-01-22 LAB — LIPID PANEL
HDL: 22 mg/dL — ABNORMAL LOW (ref >39)
LDL Cholesterol: 67 mg/dL (ref 0–99)
Total CHOL/HDL Ratio: 5.4 Ratio
Triglycerides: 148 mg/dL (ref <150)

## 2011-01-22 NOTE — Progress Notes (Signed)
Patient here for week 256 ALLRT study visit. BP today is 163/100. He had not taken his BP med yet for today (HCTZ) He plans on seeing his primary MD for followup. He was recently treated for rosacea by dermatology. He will return in October for his next visit for study.

## 2011-01-22 NOTE — Progress Notes (Signed)
Addended by: Armida Sans on: 01/22/2011 01:32 PM   Modules accepted: Orders

## 2011-02-18 ENCOUNTER — Encounter: Payer: Self-pay | Admitting: Infectious Disease

## 2011-02-18 LAB — CD4/CD8 (T-HELPER/T-SUPPRESSOR CELL)
CD4: 575
CD8 % Suppressor T Cell: 30.5

## 2011-03-25 ENCOUNTER — Other Ambulatory Visit: Payer: Self-pay | Admitting: *Deleted

## 2011-03-25 DIAGNOSIS — B20 Human immunodeficiency virus [HIV] disease: Secondary | ICD-10-CM

## 2011-03-25 MED ORDER — RALTEGRAVIR POTASSIUM 400 MG PO TABS: 400.0000 mg | ORAL_TABLET | Freq: Two times a day (BID) | ORAL | Status: DC

## 2011-03-25 MED ORDER — EMTRICITABINE-TENOFOVIR DF 200-300 MG PO TABS: 1.0000 | ORAL_TABLET | Freq: Every day | ORAL | Status: DC

## 2011-03-25 NOTE — Telephone Encounter (Signed)
Called into [facility] for 11 refills per patient request.

## 2011-07-02 ENCOUNTER — Ambulatory Visit (INDEPENDENT_AMBULATORY_CARE_PROVIDER_SITE_OTHER): Payer: Self-pay | Admitting: *Deleted

## 2011-07-02 DIAGNOSIS — B20 Human immunodeficiency virus [HIV] disease: Secondary | ICD-10-CM

## 2011-07-02 LAB — COMPREHENSIVE METABOLIC PANEL
ALT: 76 U/L — ABNORMAL HIGH (ref 0–53)
AST: 50 U/L — ABNORMAL HIGH (ref 0–37)
Albumin: 5 g/dL (ref 3.5–5.2)
BUN: 15 mg/dL (ref 6–23)
CO2: 26 mEq/L (ref 19–32)
Calcium: 9.9 mg/dL (ref 8.4–10.5)
Chloride: 103 mEq/L (ref 96–112)
Creat: 1.12 mg/dL (ref 0.50–1.35)
Potassium: 4.2 mEq/L (ref 3.5–5.3)

## 2011-07-02 LAB — HEPATITIS B CORE ANTIBODY, TOTAL: Hep B Core Total Ab: NEGATIVE

## 2011-07-02 LAB — LIPID PANEL
Cholesterol: 137 mg/dL (ref 0–200)
HDL: 23 mg/dL — ABNORMAL LOW (ref >39)

## 2011-07-02 LAB — HEPATITIS C ANTIBODY: HCV Ab: NEGATIVE

## 2011-07-02 NOTE — Progress Notes (Signed)
Patient here for his week 288 ALLRT study visit. He denies any current complaints. He recently had a complete physical with his primary MD and he said that everything was fine. No changes with meds. He nneds to see Dr. Daiva Eves soon and he plans to schedule an appt after the first of the year. He was also given a flushot at his MD visit. He will return in April for his next study appt.

## 2011-07-30 ENCOUNTER — Encounter: Payer: Self-pay | Admitting: Infectious Disease

## 2011-07-30 LAB — CD4/CD8 (T-HELPER/T-SUPPRESSOR CELL)
CD4%: 42.9
CD8: 425

## 2011-07-30 LAB — HIV-1 RNA QUANT-NO REFLEX-BLD: HIV-1 RNA Viral Load: <40

## 2011-12-10 ENCOUNTER — Ambulatory Visit (INDEPENDENT_AMBULATORY_CARE_PROVIDER_SITE_OTHER): Payer: Self-pay | Admitting: *Deleted

## 2011-12-10 VITALS — BP 132/82 | HR 72 | Temp 98.5°F | Wt 178.0 lb

## 2011-12-10 DIAGNOSIS — B20 Human immunodeficiency virus [HIV] disease: Secondary | ICD-10-CM

## 2011-12-10 LAB — LIPID PANEL
HDL: 27 mg/dL — ABNORMAL LOW (ref >39)
LDL Cholesterol: 64 mg/dL (ref 0–99)
Total CHOL/HDL Ratio: 4 Ratio
Triglycerides: 85 mg/dL (ref <150)

## 2011-12-10 LAB — COMPREHENSIVE METABOLIC PANEL
AST: 30 U/L (ref 0–37)
Albumin: 4.7 g/dL (ref 3.5–5.2)
Alkaline Phosphatase: 64 U/L (ref 39–117)
BUN: 10 mg/dL (ref 6–23)
Calcium: 9.7 mg/dL (ref 8.4–10.5)
Creat: 1.07 mg/dL (ref 0.50–1.35)
Glucose, Bld: 96 mg/dL (ref 70–99)
Potassium: 3.8 mEq/L (ref 3.5–5.3)

## 2011-12-10 NOTE — Progress Notes (Signed)
Pt here for study A5001, week 304. Pt stated he lost his job in December and has been depressed ever since. His boss has Parkinson's and was forced to close his business forcing everyone to be let go. Pt has had depression in the past when he was diagnosed with HIV and did not want to go thru that again. He went to psychiatrist who prescribed him new medication (see Med list). Pt understands it will take 3-4 weeks to obtain a therapeutic level of the drug and for him to start feeling the effects. He did state he is able to sleep better now that he has started temazepam. He denies feelings of harming himself or others. He is still adhering to his ARV regimen and has no complaints. Pt is now on his wifes insurance. I gave him copay assist cards for his ARV's to help with his copays that have increased since he has switched insurance. Other than the above there have been no other problems, signs, or symptoms. Informed him of study closure and discussed the letter to study participants; Answered questions and pt verbalized understanding, signed the letter, and received a copy. Fasting labs were drawn and vital signs are stable. Pt received $20.00 gift card for study visit. Next and last study visit is scheduled for Tuesday, May 09, 2012 @ 8am. Pt was informed of HAILO study, coming in September, that he is eligible for; He is interested and will probably be enrolled 04/2012. Tacey Heap RN

## 2011-12-29 ENCOUNTER — Encounter: Payer: Self-pay | Admitting: Infectious Disease

## 2011-12-29 LAB — HIV-1 RNA QUANT-NO REFLEX-BLD: HIV-1 RNA Viral Load: <40

## 2011-12-29 LAB — CD4/CD8 (T-HELPER/T-SUPPRESSOR CELL)
CD4%: 47.4
CD8: 345

## 2012-01-06 ENCOUNTER — Encounter: Payer: Self-pay | Admitting: Infectious Disease

## 2012-03-07 ENCOUNTER — Ambulatory Visit (INDEPENDENT_AMBULATORY_CARE_PROVIDER_SITE_OTHER): Payer: BC Managed Care – PPO | Admitting: *Deleted

## 2012-03-07 VITALS — BP 130/85 | HR 86 | Temp 98.7°F | Wt 182.8 lb

## 2012-03-07 DIAGNOSIS — B20 Human immunodeficiency virus [HIV] disease: Secondary | ICD-10-CM

## 2012-03-07 LAB — COMPREHENSIVE METABOLIC PANEL
ALT: 30 U/L (ref 0–53)
Albumin: 4.9 g/dL (ref 3.5–5.2)
Alkaline Phosphatase: 62 U/L (ref 39–117)
Glucose, Bld: 116 mg/dL — ABNORMAL HIGH (ref 70–99)
Potassium: 4.8 mEq/L (ref 3.5–5.3)
Sodium: 140 mEq/L (ref 135–145)
Total Bilirubin: 0.5 mg/dL (ref 0.3–1.2)
Total Protein: 7.5 g/dL (ref 6.0–8.3)

## 2012-03-07 LAB — LIPID PANEL
LDL Cholesterol: 73 mg/dL (ref 0–99)
VLDL: 36 mg/dL (ref 0–40)

## 2012-03-07 NOTE — Progress Notes (Signed)
Patient was here for the final ALLRT study visit. He continues to be depressed about being unemployed, but says the medication his psychiatrist has prescribed is helping. He denies any other current problems. He is on his wife's insurance and has been able to get his medications. He is interested in enrolling in the [facility] study this Fall.

## 2012-03-08 LAB — CREATININE, URINE, RANDOM: Creatinine, Urine: 199.9 mg/dL

## 2012-03-08 LAB — PROTEIN, URINE, RANDOM: Total Protein, Urine: 23 mg/dL

## 2012-03-29 ENCOUNTER — Other Ambulatory Visit: Payer: Self-pay | Admitting: Infectious Disease

## 2012-04-10 LAB — HIV-1 RNA QUANT-NO REFLEX-BLD: HIV 1 RNA Quant: <40

## 2012-04-10 NOTE — Addendum Note (Signed)
Addended by: Phill Myron on: 04/10/2012 04:55 PM   Modules accepted: Orders

## 2012-09-14 ENCOUNTER — Other Ambulatory Visit: Payer: Self-pay

## 2012-09-14 ENCOUNTER — Ambulatory Visit (INDEPENDENT_AMBULATORY_CARE_PROVIDER_SITE_OTHER): Payer: Self-pay | Admitting: *Deleted

## 2012-09-14 VITALS — BP 145/89 | HR 67 | Temp 97.6°F | Ht 67.5 in | Wt 180.5 lb

## 2012-09-14 DIAGNOSIS — B20 Human immunodeficiency virus [HIV] disease: Secondary | ICD-10-CM

## 2012-09-14 LAB — CBC WITH DIFFERENTIAL/PLATELET
Basophils Absolute: 0 10*3/uL (ref 0.0–0.1)
Eosinophils Absolute: 0.1 10*3/uL (ref 0.0–0.7)
Eosinophils Relative: 2 % (ref 0–5)
HCT: 49.1 % (ref 39.0–52.0)
Lymphocytes Relative: 21 % (ref 12–46)
Lymphs Abs: 1.1 10*3/uL (ref 0.7–4.0)
MCH: 32.2 pg (ref 26.0–34.0)
MCV: 90.4 fL (ref 78.0–100.0)
Monocytes Absolute: 0.4 10*3/uL (ref 0.1–1.0)
Platelets: 165 10*3/uL (ref 150–400)
RDW: 13.9 % (ref 11.5–15.5)
WBC: 5.1 10*3/uL (ref 4.0–10.5)

## 2012-09-14 LAB — LIPID PANEL
Cholesterol: 122 mg/dL (ref 0–200)
LDL Cholesterol: 66 mg/dL (ref 0–99)
Total CHOL/HDL Ratio: 4.4 Ratio
VLDL: 28 mg/dL (ref 0–40)

## 2012-09-14 LAB — COMPREHENSIVE METABOLIC PANEL
ALT: 42 U/L (ref 0–53)
AST: 27 U/L (ref 0–37)
Alkaline Phosphatase: 73 U/L (ref 39–117)
Creat: 1.05 mg/dL (ref 0.50–1.35)
Sodium: 140 mEq/L (ref 135–145)
Total Bilirubin: 0.8 mg/dL (ref 0.3–1.2)
Total Protein: 7.6 g/dL (ref 6.0–8.3)

## 2012-09-14 LAB — HEPATITIS C ANTIBODY: HCV Ab: NEGATIVE

## 2012-09-14 NOTE — Progress Notes (Signed)
Patient here today to enroll in study A5322, the HAILO study. He denies any new health problems or concerns, except for being out of work and unable to find a job. He is still taking an antidepressant and antianxiety agent to help with the stress. He did not want to have any labs drawn that would go to his insurance because of having to pay the deductibles on his insurance. I did tell him he would need to get at least a viral load and CD4 soon and see his physician to continue to get his antiretrovirals filled. He understands and agrees to see him within the next 6 months.

## 2012-09-15 LAB — HEPATITIS B SURFACE ANTIBODY,QUALITATIVE: Hep B S Ab: REACTIVE — AB

## 2013-02-01 ENCOUNTER — Other Ambulatory Visit: Payer: Self-pay

## 2013-02-01 ENCOUNTER — Ambulatory Visit (INDEPENDENT_AMBULATORY_CARE_PROVIDER_SITE_OTHER): Payer: Self-pay | Admitting: *Deleted

## 2013-02-01 ENCOUNTER — Encounter: Payer: Self-pay | Admitting: *Deleted

## 2013-02-01 VITALS — BP 139/83 | HR 116 | Temp 97.6°F | Ht 67.5 in | Wt 175.5 lb

## 2013-02-01 DIAGNOSIS — Z21 Asymptomatic human immunodeficiency virus [HIV] infection status: Secondary | ICD-10-CM

## 2013-02-01 DIAGNOSIS — B20 Human immunodeficiency virus [HIV] disease: Secondary | ICD-10-CM

## 2013-02-01 NOTE — Progress Notes (Signed)
Patient here for week 24 A5322 study visit. He denies any new complaints presently. He is still looking for a job and had put off getting his Viral load and CD4 drawn until today. He is scheduled to see Dr. Daiva Eves next week. We discussed the Stri[facility] Study and he is willing to screen next week when he is coming in to see him. He should be eligible as long as his viral load is within range. There is no documented genotype in his medical record so he probably never had one at the time that he started treatment -June 2007.  He will come back in December for the next A5322 visit.

## 2013-02-02 LAB — HIV-1 RNA QUANT-NO REFLEX-BLD
HIV 1 RNA Quant: <20 copies/mL (ref <20)
HIV-1 RNA Quant, Log: <1.3 {Log} (ref <1.30)

## 2013-02-07 ENCOUNTER — Encounter: Payer: Self-pay | Admitting: Infectious Disease

## 2013-02-07 ENCOUNTER — Ambulatory Visit (INDEPENDENT_AMBULATORY_CARE_PROVIDER_SITE_OTHER): Payer: BC Managed Care – PPO | Admitting: Infectious Disease

## 2013-02-07 VITALS — BP 151/92 | HR 121 | Temp 98.3°F | Wt 173.0 lb

## 2013-02-07 DIAGNOSIS — F411 Generalized anxiety disorder: Secondary | ICD-10-CM

## 2013-02-07 DIAGNOSIS — G4700 Insomnia, unspecified: Secondary | ICD-10-CM

## 2013-02-07 DIAGNOSIS — F329 Major depressive disorder, single episode, unspecified: Secondary | ICD-10-CM

## 2013-02-07 DIAGNOSIS — I10 Essential (primary) hypertension: Secondary | ICD-10-CM

## 2013-02-07 DIAGNOSIS — F3289 Other specified depressive episodes: Secondary | ICD-10-CM

## 2013-02-07 DIAGNOSIS — F32A Depression, unspecified: Secondary | ICD-10-CM

## 2013-02-07 DIAGNOSIS — B20 Human immunodeficiency virus [HIV] disease: Secondary | ICD-10-CM

## 2013-02-07 NOTE — Progress Notes (Signed)
  Subjective:    Patient ID: Micheal Morales, male    DOB: 1950/09/14, 62 y.o.   MRN: [id number]  HPI  Ammar Moffatt is a 62 y.o. male with HIV infection who is doing superbly well on his  antiviral regimen, isentress and truvada with undetectable viral load and health cd4 count returns for followup visit. He has not complaints today. His BP is up, and he claims this is due to anxiety about being at ID MD office.   Review of Systems  Constitutional: Negative for fever, chills, diaphoresis, activity change, appetite change, fatigue and unexpected weight change.  HENT: Negative for congestion, sore throat, rhinorrhea, sneezing, trouble swallowing and sinus pressure.   Eyes: Negative for photophobia and visual disturbance.  Respiratory: Negative for cough, chest tightness, shortness of breath, wheezing and stridor.   Cardiovascular: Negative for chest pain, palpitations and leg swelling.  Gastrointestinal: Negative for nausea, vomiting, abdominal pain, diarrhea, constipation, blood in stool, abdominal distention and anal bleeding.  Genitourinary: Negative for dysuria, hematuria, flank pain and difficulty urinating.  Musculoskeletal: Negative for myalgias, back pain, joint swelling, arthralgias and gait problem.  Skin: Negative for color change, pallor, rash and wound.  Neurological: Negative for dizziness, tremors, weakness and light-headedness.  Hematological: Negative for adenopathy. Does not bruise/bleed easily.  Psychiatric/Behavioral: Negative for behavioral problems, confusion, sleep disturbance, dysphoric mood, decreased concentration and agitation.       Objective:   Physical Exam  Constitutional: He is oriented to person, place, and time. He appears well-developed and well-nourished. No distress.  HENT:  Head: Normocephalic and atraumatic.  Mouth/Throat: Oropharynx is clear and moist. No oropharyngeal exudate.  Eyes: Conjunctivae and EOM are normal. Pupils are equal, round, and  reactive to light. No scleral icterus.  Neck: Normal range of motion. Neck supple. No JVD present.  Cardiovascular: Normal rate, regular rhythm and normal heart sounds.  Exam reveals no gallop and no friction rub.   No murmur heard. Pulmonary/Chest: Effort normal and breath sounds normal. No respiratory distress. He has no wheezes. He has no rales. He exhibits no tenderness.  Abdominal: He exhibits no distension and no mass. There is no tenderness. There is no rebound and no guarding.  Musculoskeletal: He exhibits no edema and no tenderness.  Lymphadenopathy:    He has no cervical adenopathy.  Neurological: He is alert and oriented to person, place, and time. He exhibits normal muscle tone. Coordination normal.  Skin: Skin is warm and dry. He is not diaphoretic. No erythema. No pallor.  Psychiatric: He has a normal mood and affect. His behavior is normal. Judgment and thought content normal.          Assessment & Plan:   HIV: perfect control, continue isentress and truvada  Anxiety and Depression: continue risperdal and ativan  Insomnia: continue temazepam  HTN: check microalbumin to creatinine ratio

## 2013-02-08 LAB — MICROALBUMIN / CREATININE URINE RATIO
Creatinine, Urine: 237.9 mg/dL
Microalb, Ur: 10.82 mg/dL — ABNORMAL HIGH (ref 0.00–1.89)

## 2013-03-29 ENCOUNTER — Other Ambulatory Visit: Payer: Self-pay | Admitting: Infectious Disease

## 2013-07-03 ENCOUNTER — Ambulatory Visit: Payer: BC Managed Care – PPO | Admitting: *Deleted

## 2013-07-03 ENCOUNTER — Other Ambulatory Visit (INDEPENDENT_AMBULATORY_CARE_PROVIDER_SITE_OTHER): Payer: BC Managed Care – PPO

## 2013-07-03 VITALS — BP 154/83 | HR 94 | Temp 98.1°F | Ht 67.5 in | Wt 176.5 lb

## 2013-07-03 DIAGNOSIS — Z0000 Encounter for general adult medical examination without abnormal findings: Secondary | ICD-10-CM

## 2013-07-03 DIAGNOSIS — Z21 Asymptomatic human immunodeficiency virus [HIV] infection status: Secondary | ICD-10-CM

## 2013-07-03 LAB — CBC WITH DIFFERENTIAL/PLATELET
Lymphocytes Relative: 15 % (ref 12–46)
Lymphs Abs: 1 10*3/uL (ref 0.7–4.0)
MCV: 90.2 fL (ref 78.0–100.0)
Neutrophils Relative %: 75 % (ref 43–77)
Platelets: 188 10*3/uL (ref 150–400)
RBC: 5.49 MIL/uL (ref 4.22–5.81)
WBC: 6.4 10*3/uL (ref 4.0–10.5)

## 2013-07-03 LAB — COMPREHENSIVE METABOLIC PANEL
ALT: 25 U/L (ref 0–53)
CO2: 25 mEq/L (ref 19–32)
Calcium: 9.8 mg/dL (ref 8.4–10.5)
Chloride: 103 mEq/L (ref 96–112)
Sodium: 141 mEq/L (ref 135–145)
Total Protein: 7.5 g/dL (ref 6.0–8.3)

## 2013-07-03 LAB — LIPID PANEL: Cholesterol: 133 mg/dL (ref 0–200)

## 2013-07-03 NOTE — Progress Notes (Signed)
Patient here for week 48 study visit. He denies any new problems, but still continues to struggle with depression and anxiety related to being out of work. He will return in June for the next study appt.

## 2013-07-04 LAB — HIV-1 RNA QUANT-NO REFLEX-BLD
HIV 1 RNA Quant: <20 copies/mL (ref <20)
HIV-1 RNA Quant, Log: <1.3 {Log} (ref <1.30)

## 2013-07-04 LAB — T-HELPER CELL (CD4) - (RCID CLINIC ONLY): CD4 T Cell Abs: 490 /uL (ref 400–2700)

## 2013-09-28 ENCOUNTER — Encounter: Payer: Self-pay | Admitting: *Deleted

## 2014-01-22 ENCOUNTER — Ambulatory Visit (INDEPENDENT_AMBULATORY_CARE_PROVIDER_SITE_OTHER): Payer: Self-pay | Admitting: *Deleted

## 2014-01-22 VITALS — BP 154/89 | HR 80 | Temp 98.3°F | Wt 186.8 lb

## 2014-01-22 DIAGNOSIS — Z21 Asymptomatic human immunodeficiency virus [HIV] infection status: Secondary | ICD-10-CM

## 2014-01-22 DIAGNOSIS — B20 Human immunodeficiency virus [HIV] disease: Secondary | ICD-10-CM

## 2014-01-22 LAB — HEMOGLOBIN A1C
Hgb A1c MFr Bld: 5.3 % (ref <5.7)
Mean Plasma Glucose: 105 mg/dL (ref <117)

## 2014-01-22 MED ORDER — RALTEGRAVIR POTASSIUM 400 MG PO TABS: ORAL_TABLET | ORAL | Status: DC

## 2014-01-22 MED ORDER — EMTRICITABINE-TENOFOVIR DF 200-300 MG PO TABS: ORAL_TABLET | ORAL | Status: DC

## 2014-01-22 NOTE — Progress Notes (Signed)
Patient here for week 72 Hailo study visit. He continues to be depressed and anxious over his current financial status. He still has not found a job and does not have the money to take care of his health properly. His father recently died and he had been taking care of him up to then.  His former psychiatrist retired and he has been without his cymbalta and lorazepam the past month. I offerred him counseling with one of the counselors here but he declined and feels he just needs his meds refilled. I will ask Dr. Daiva Eves if he will prescribe them.We are able to get the viral load on study at this visit and will be able to get most of his labs at the next visit in 6 months.

## 2014-01-23 ENCOUNTER — Telehealth: Payer: Self-pay | Admitting: *Deleted

## 2014-01-23 MED ORDER — LORAZEPAM 0.5 MG PO TABS: 0.5000 mg | ORAL_TABLET | Freq: Four times a day (QID) | ORAL | Status: DC | PRN

## 2014-01-23 MED ORDER — DULOXETINE HCL 30 MG PO CPEP: 30.0000 mg | ORAL_CAPSULE | Freq: Three times a day (TID) | ORAL | Status: DC

## 2014-01-23 NOTE — Telephone Encounter (Signed)
Discussed refilling duloxetine and lorazepam for Micheal Morales with Dr. Daiva Eves. He said it was okay to do that short term but that the patient needs to get established with a new psychiatrist soon. I spoke with [name] and explained that  He needed to find a new psychiatrist to prescribe these meds fo him.

## 2014-01-24 ENCOUNTER — Other Ambulatory Visit: Payer: Self-pay | Admitting: Infectious Disease

## 2014-02-08 ENCOUNTER — Encounter: Payer: Self-pay | Admitting: Infectious Disease

## 2014-02-08 LAB — HIV-1 RNA QUANT-NO REFLEX-BLD: HIV-1 RNA Viral Load: <40

## 2014-06-25 ENCOUNTER — Ambulatory Visit (INDEPENDENT_AMBULATORY_CARE_PROVIDER_SITE_OTHER): Payer: BC Managed Care – PPO | Admitting: *Deleted

## 2014-06-25 ENCOUNTER — Encounter: Payer: Self-pay | Admitting: *Deleted

## 2014-06-25 VITALS — BP 151/93 | HR 105 | Temp 98.5°F | Ht 67.5 in | Wt 195.2 lb

## 2014-06-25 DIAGNOSIS — Z006 Encounter for examination for normal comparison and control in clinical research program: Secondary | ICD-10-CM

## 2014-06-25 DIAGNOSIS — B20 Human immunodeficiency virus [HIV] disease: Secondary | ICD-10-CM

## 2014-06-25 LAB — LIPID PANEL
CHOLESTEROL: 137 mg/dL (ref 0–200)
HDL: 26 mg/dL — ABNORMAL LOW (ref >39)
LDL Cholesterol: 63 mg/dL (ref 0–99)
TRIGLYCERIDES: 238 mg/dL — AB (ref <150)
Total CHOL/HDL Ratio: 5.3 Ratio
VLDL: 48 mg/dL — AB (ref 0–40)

## 2014-06-25 LAB — HEPATITIS C ANTIBODY: HCV Ab: NEGATIVE

## 2014-06-25 LAB — COMPREHENSIVE METABOLIC PANEL
ALT: 65 U/L — ABNORMAL HIGH (ref 0–53)
AST: 43 U/L — ABNORMAL HIGH (ref 0–37)
Albumin: 5 g/dL (ref 3.5–5.2)
Alkaline Phosphatase: 85 U/L (ref 39–117)
BILIRUBIN TOTAL: 0.7 mg/dL (ref 0.2–1.2)
BUN: 17 mg/dL (ref 6–23)
CALCIUM: 9.5 mg/dL (ref 8.4–10.5)
CHLORIDE: 102 meq/L (ref 96–112)
CO2: 22 meq/L (ref 19–32)
CREATININE: 1.24 mg/dL (ref 0.50–1.35)
GLUCOSE: 116 mg/dL — AB (ref 70–99)
Potassium: 4 mEq/L (ref 3.5–5.3)
Sodium: 138 mEq/L (ref 135–145)
Total Protein: 7.7 g/dL (ref 6.0–8.3)

## 2014-06-25 LAB — HEMOGLOBIN A1C
Hgb A1c MFr Bld: 5.6 % (ref <5.7)
MEAN PLASMA GLUCOSE: 114 mg/dL (ref <117)

## 2014-06-25 NOTE — Progress Notes (Signed)
Casimiro NeedleMichael is here for A5322, week 96. In the past 6 months he has dealt with the passing of his father as well as the passing of his wife's father. He states he is having difficulty staying asleep and continues to have episodes of anxiety and depression. He is taking Cymbalta which he feels is helping but the ativan does not seem to be enough. He stated that his psychiatrist prescribed this dose because they wanted to start off with a low dose to see the effects. His psychiatrist has retired and he does not have one he is seeing at the moment. I gave him Kenny's card and he has an appt with Dr. Daiva EvesVan Dam in two weeks for routine check up and to discuss the above.  Fasting labs were drawn. Questionnaires ad neuro test completed. He received $50 gift card for visit. Next appointment scheduled for Tuesday, December 10, 2014 @ 8:30am Tacey HeapElisha Epperson RN

## 2014-06-26 LAB — PROTEIN, URINE, RANDOM: TOTAL PROTEIN, URINE: 68 mg/dL — AB (ref 5–25)

## 2014-06-26 LAB — CREATININE, URINE, RANDOM: Creatinine, Urine: 127.8 mg/dL

## 2014-07-08 ENCOUNTER — Encounter: Payer: Self-pay | Admitting: Infectious Disease

## 2014-07-08 ENCOUNTER — Ambulatory Visit (INDEPENDENT_AMBULATORY_CARE_PROVIDER_SITE_OTHER): Payer: BC Managed Care – PPO | Admitting: Infectious Disease

## 2014-07-08 VITALS — BP 143/94 | HR 90 | Temp 98.5°F | Wt 193.0 lb

## 2014-07-08 DIAGNOSIS — F4323 Adjustment disorder with mixed anxiety and depressed mood: Secondary | ICD-10-CM

## 2014-07-08 DIAGNOSIS — Z23 Encounter for immunization: Secondary | ICD-10-CM

## 2014-07-08 DIAGNOSIS — B20 Human immunodeficiency virus [HIV] disease: Secondary | ICD-10-CM

## 2014-07-08 DIAGNOSIS — I10 Essential (primary) hypertension: Secondary | ICD-10-CM

## 2014-07-08 MED ORDER — DULOXETINE HCL 30 MG PO CPEP: 30.0000 mg | ORAL_CAPSULE | Freq: Three times a day (TID) | ORAL | Status: DC

## 2014-07-08 MED ORDER — LORAZEPAM 0.5 MG PO TABS: 0.5000 mg | ORAL_TABLET | Freq: Four times a day (QID) | ORAL | Status: DC | PRN

## 2014-07-08 NOTE — Progress Notes (Signed)
  Subjective:    Patient ID: Micheal Morales, male    DOB: [birth year], 63 y.o.   MRN: [id number]  HPI   Micheal Morales is a 63 y.o. male with HIV infection who is doing superbly well on his  antiviral regimen, isentress and truvada with undetectable viral load and health cd4 count returns for followup visit.  Lab Results  Component Value Date   HIV1RNAQUANT <20 07/03/2013   Lab Results  Component Value Date   CD4TABS 490 07/03/2013   CD4TABS 400 02/01/2013   CD4TABS 521 12/10/2011    He came in today requesting for increase in dose of his ativan and his anti-depressant that I had filled for him in June--but which had been initiated by psychiatrist.  I told him I would give him one more rx for his benzo with sufficient refills to get him through 4 months--BUT THAT ALL FUTURE RX MUST BE FROM ANOTHER PROVIDER PSYCHIATRIST OR PCP.   Review of Systems  Constitutional: Negative for fever, chills, diaphoresis, activity change, appetite change, fatigue and unexpected weight change.  HENT: Negative for congestion, rhinorrhea, sinus pressure, sneezing, sore throat and trouble swallowing.   Eyes: Negative for photophobia and visual disturbance.  Respiratory: Negative for cough, chest tightness, shortness of breath, wheezing and stridor.   Cardiovascular: Negative for chest pain, palpitations and leg swelling.  Gastrointestinal: Negative for nausea, vomiting, abdominal pain, diarrhea, constipation, blood in stool, abdominal distention and anal bleeding.  Genitourinary: Negative for dysuria, hematuria, flank pain and difficulty urinating.  Musculoskeletal: Negative for myalgias, back pain, joint swelling, arthralgias and gait problem.  Skin: Negative for color change, pallor, rash and wound.  Neurological: Negative for dizziness, tremors, weakness and light-headedness.  Hematological: Negative for adenopathy. Does not bruise/bleed easily.  Psychiatric/Behavioral: Negative for behavioral  problems, confusion, sleep disturbance, dysphoric mood, decreased concentration and agitation.       Objective:   Physical Exam  Constitutional: He is oriented to person, place, and time. He appears well-developed and well-nourished. No distress.  HENT:  Head: Normocephalic and atraumatic.  Mouth/Throat: Oropharynx is clear and moist. No oropharyngeal exudate.  Eyes: Conjunctivae and EOM are normal. No scleral icterus.  Neck: Normal range of motion. Neck supple.  Cardiovascular: Normal rate and regular rhythm.   Pulmonary/Chest: Effort normal. No respiratory distress. He has no wheezes.  Abdominal: He exhibits no distension.  Musculoskeletal: He exhibits no edema or tenderness.  Neurological: He is alert and oriented to person, place, and time. He exhibits normal muscle tone. Coordination normal.  Skin: Skin is warm and dry. He is not diaphoretic. No erythema. No pallor.  Psychiatric: He has a normal mood and affect. His behavior is normal. Judgment and thought content normal.          Assessment & Plan:   HIV: perfect control, continue isentress and truvada  Anxiety and Depression: OK TO GIVE HIM THIS HIS LAST RX FOR BENZODIAZEPENE FROM US. I spent greater than 25 minutes with the patient including greater than 50% of time in face to face counsel of the patient and in coordination of their care.   HTN: defer to PCP

## 2014-07-23 ENCOUNTER — Encounter: Payer: Self-pay | Admitting: Infectious Disease

## 2014-07-23 LAB — CD4/CD8 (T-HELPER/T-SUPPRESSOR CELL)
CD4%: 49.3
CD4: 542
CD8 T CELL SUPPRESSOR: 30.6
CD8: 337

## 2014-07-23 LAB — HIV-1 RNA QUANT-NO REFLEX-BLD: HIV-1 RNA Viral Load: 71

## 2014-08-14 ENCOUNTER — Encounter: Payer: Self-pay | Admitting: Infectious Disease

## 2014-10-31 ENCOUNTER — Other Ambulatory Visit: Payer: Self-pay | Admitting: *Deleted

## 2014-10-31 DIAGNOSIS — Z113 Encounter for screening for infections with a predominantly sexual mode of transmission: Secondary | ICD-10-CM

## 2014-12-02 ENCOUNTER — Telehealth: Payer: Self-pay | Admitting: *Deleted

## 2014-12-02 DIAGNOSIS — F4323 Adjustment disorder with mixed anxiety and depressed mood: Secondary | ICD-10-CM

## 2014-12-02 NOTE — Telephone Encounter (Signed)
Received warning when duloxetine 30 mg TID was entered in for refill.  BID dosing was stated.  OK to refill as TID?  MD please advise.

## 2014-12-05 NOTE — Telephone Encounter (Signed)
Just fill BID

## 2014-12-10 ENCOUNTER — Other Ambulatory Visit: Payer: Self-pay | Admitting: *Deleted

## 2014-12-10 ENCOUNTER — Ambulatory Visit (INDEPENDENT_AMBULATORY_CARE_PROVIDER_SITE_OTHER): Payer: Self-pay | Admitting: *Deleted

## 2014-12-10 VITALS — BP 149/88 | HR 79 | Temp 98.4°F | Resp 16 | Wt 201.0 lb

## 2014-12-10 DIAGNOSIS — Z006 Encounter for examination for normal comparison and control in clinical research program: Secondary | ICD-10-CM

## 2014-12-10 MED ORDER — LITHIUM CARBONATE 300 MG PO CAPS: 300.0000 mg | ORAL_CAPSULE | Freq: Three times a day (TID) | ORAL | Status: DC

## 2014-12-10 MED ORDER — DULOXETINE HCL 60 MG PO CPEP: 60.0000 mg | ORAL_CAPSULE | Freq: Two times a day (BID) | ORAL | Status: DC

## 2014-12-10 NOTE — Progress Notes (Signed)
Micheal Morales is here for A5322, wk120. We reviewed the letter of amendment #2. I answered his questions, he verbalized understanding, and he signed the LOA that was witnessed by me. I gave him a copy of the signed consent. He continues to suffer from anxiety and depression. His psychiatrist increased Cymbalta and started Lithium at the beginning of April. He states he has not felt any different but knows it can take at least a month to feel the effects. 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 This is the main reason of his stress and depression. He has had some heart palpitations in which his PCP sent him for a 2D-echo yesterday. He denies weakness, dizziness, drowsiness, or muscle twitching. I told him stress can cause this to happen. Fasting labs were drawn and vital signs taken. His blood pressure is elevated but lower than usual. He states it is lower when he takes it at the drug store.... He received $50 gift card for visit. Next appt scheduled for Tuesday, July 01, 2015 @ 8:30am. Tacey HeapElisha Kanon Novosel RN

## 2014-12-10 NOTE — Telephone Encounter (Signed)
Thanks Denise! 

## 2014-12-10 NOTE — Addendum Note (Signed)
Addended by: Jennet MaduroESTRIDGE, DENISE D on: 12/10/2014 03:03 PM   Modules accepted: Medications

## 2014-12-10 NOTE — Telephone Encounter (Signed)
[facility], the prescription was called in by the pt's psychiatrist as Cymbalta 60 mg once daily.  The psychiatrist is going to be responsible for future refills [facility]

## 2014-12-12 ENCOUNTER — Telehealth: Payer: Self-pay | Admitting: *Deleted

## 2014-12-12 NOTE — Telephone Encounter (Signed)
Micheal Morales had an appointment with me earlier in the day and told me that his psychiatrist is now prescribing Cymbalta 60mg  twice daily. After Micheal Morales spoke with pharmacy she found out that Cymbalta was prescribed for 60mg  Daily. I spoke with patient to clarify the amoutn he should be taking. He thought he should be taking it twice a day but did verbalize understanding that he should be taking a total of 60mg  per day. I told him to contact his psychiatrist office if he has questions about the dose.  Tacey HeapElisha Epperson RN

## 2014-12-20 ENCOUNTER — Other Ambulatory Visit: Payer: Self-pay | Admitting: Licensed Clinical Social Worker

## 2015-01-21 ENCOUNTER — Other Ambulatory Visit: Payer: Self-pay | Admitting: Infectious Disease

## 2015-07-01 ENCOUNTER — Encounter (INDEPENDENT_AMBULATORY_CARE_PROVIDER_SITE_OTHER): Payer: Self-pay | Admitting: *Deleted

## 2015-07-01 VITALS — BP 153/90 | HR 91 | Temp 98.0°F | Resp 16 | Ht 67.5 in | Wt 202.0 lb

## 2015-07-01 DIAGNOSIS — Z23 Encounter for immunization: Secondary | ICD-10-CM

## 2015-07-01 DIAGNOSIS — Z006 Encounter for examination for normal comparison and control in clinical research program: Secondary | ICD-10-CM

## 2015-07-01 LAB — COMPREHENSIVE METABOLIC PANEL
ALK PHOS: 83 U/L (ref 40–115)
ALT: 124 U/L — AB (ref 9–46)
AST: 69 U/L — AB (ref 10–35)
Albumin: 4.4 g/dL (ref 3.6–5.1)
BILIRUBIN TOTAL: 0.7 mg/dL (ref 0.2–1.2)
BUN: 15 mg/dL (ref 7–25)
CALCIUM: 9.8 mg/dL (ref 8.6–10.3)
CO2: 23 mmol/L (ref 20–31)
CREATININE: 1.02 mg/dL (ref 0.70–1.25)
Chloride: 103 mmol/L (ref 98–110)
GLUCOSE: 149 mg/dL — AB (ref 65–99)
Potassium: 4.2 mmol/L (ref 3.5–5.3)
SODIUM: 140 mmol/L (ref 135–146)
Total Protein: 7.3 g/dL (ref 6.1–8.1)

## 2015-07-01 LAB — CD4/CD8 (T-HELPER/T-SUPPRESSOR CELL)
CD4%: 54.2
CD4: 542
CD8 % Suppressor T Cell: 25.9
CD8: 259

## 2015-07-01 LAB — HIV-1 RNA QUANT-NO REFLEX-BLD: HIV-1 RNA Viral Load: <40

## 2015-07-01 LAB — LIPID PANEL
Cholesterol: 150 mg/dL (ref 125–200)
HDL: 18 mg/dL — AB (ref >=40)
LDL Cholesterol: 79 mg/dL (ref <130)
Total CHOL/HDL Ratio: 8.3 Ratio — ABNORMAL HIGH (ref <=5.0)
Triglycerides: 265 mg/dL — ABNORMAL HIGH (ref <150)
VLDL: 53 mg/dL — ABNORMAL HIGH (ref <30)

## 2015-07-01 LAB — HEMOGLOBIN A1C
HEMOGLOBIN A1C: 6.5 % — AB (ref <5.7)
MEAN PLASMA GLUCOSE: 140 mg/dL — AB (ref <117)

## 2015-07-01 NOTE — Progress Notes (Signed)
Micheal Morales is here for his week 144 visit for The HAILO Study: A Long Term follow-up of Older HIV-Infected Adults in the ACTG, an observational study addressing the issues of aging, HIV infection and Inflammation. I discussed the Reprieve study with him and he is interested in screening for it. We went over the informed consent together and I answered all his questions regarding it. 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 He is currently seeing a psychiatrist and has been started on gabapentin recently. He says he has numbness in both feet in his toes which is fairly constant, but denies it being painful. I gave him a flushot while he was here. He says he never misses any of his medications. He is scheduled for the A5322 study in April and I will call to schedule for the Reprieve entry if he is eligible.

## 2015-07-02 LAB — PROTEIN, URINE, RANDOM: TOTAL PROTEIN, URINE: 79 mg/dL — AB (ref 5–25)

## 2015-07-02 LAB — CREATININE, URINE, RANDOM: Creatinine, Urine: 150 mg/dL (ref 20–370)

## 2015-07-23 ENCOUNTER — Encounter: Payer: Self-pay | Admitting: Infectious Disease

## 2015-11-10 ENCOUNTER — Encounter (HOSPITAL_COMMUNITY): Payer: Self-pay | Admitting: Emergency Medicine

## 2015-11-10 ENCOUNTER — Inpatient Hospital Stay (HOSPITAL_COMMUNITY)
Admission: EM | Admit: 2015-11-10 | Discharge: 2015-11-17 | DRG: 637 | Disposition: A | Discharge status: Home Health | Payer: BLUE CROSS/BLUE SHIELD | Attending: Internal Medicine | Admitting: Internal Medicine

## 2015-11-10 ENCOUNTER — Emergency Department (HOSPITAL_COMMUNITY): Payer: BLUE CROSS/BLUE SHIELD

## 2015-11-10 DIAGNOSIS — E1310 Other specified diabetes mellitus with ketoacidosis without coma: Secondary | ICD-10-CM | POA: Diagnosis present

## 2015-11-10 DIAGNOSIS — F419 Anxiety disorder, unspecified: Secondary | ICD-10-CM | POA: Diagnosis present

## 2015-11-10 DIAGNOSIS — Z79899 Other long term (current) drug therapy: Secondary | ICD-10-CM

## 2015-11-10 DIAGNOSIS — Z6827 Body mass index (BMI) 27.0-27.9, adult: Secondary | ICD-10-CM

## 2015-11-10 DIAGNOSIS — E039 Hypothyroidism, unspecified: Secondary | ICD-10-CM | POA: Diagnosis present

## 2015-11-10 DIAGNOSIS — Z9114 Patient's other noncompliance with medication regimen: Secondary | ICD-10-CM

## 2015-11-10 DIAGNOSIS — E872 Acidosis: Secondary | ICD-10-CM | POA: Diagnosis present

## 2015-11-10 DIAGNOSIS — F32A Depression, unspecified: Secondary | ICD-10-CM | POA: Diagnosis present

## 2015-11-10 DIAGNOSIS — Z21 Asymptomatic human immunodeficiency virus [HIV] infection status: Secondary | ICD-10-CM | POA: Diagnosis not present

## 2015-11-10 DIAGNOSIS — E870 Hyperosmolality and hypernatremia: Secondary | ICD-10-CM | POA: Diagnosis present

## 2015-11-10 DIAGNOSIS — D696 Thrombocytopenia, unspecified: Secondary | ICD-10-CM | POA: Diagnosis present

## 2015-11-10 DIAGNOSIS — E876 Hypokalemia: Secondary | ICD-10-CM | POA: Diagnosis present

## 2015-11-10 DIAGNOSIS — E1101 Type 2 diabetes mellitus with hyperosmolarity with coma: Secondary | ICD-10-CM | POA: Diagnosis not present

## 2015-11-10 DIAGNOSIS — N179 Acute kidney failure, unspecified: Secondary | ICD-10-CM | POA: Diagnosis present

## 2015-11-10 DIAGNOSIS — Z9119 Patient's noncompliance with other medical treatment and regimen: Secondary | ICD-10-CM | POA: Diagnosis not present

## 2015-11-10 DIAGNOSIS — N183 Chronic kidney disease, stage 3 (moderate): Secondary | ICD-10-CM | POA: Diagnosis present

## 2015-11-10 DIAGNOSIS — Z8619 Personal history of other infectious and parasitic diseases: Secondary | ICD-10-CM

## 2015-11-10 DIAGNOSIS — F3130 Bipolar disorder, current episode depressed, mild or moderate severity, unspecified: Secondary | ICD-10-CM | POA: Diagnosis present

## 2015-11-10 DIAGNOSIS — N289 Disorder of kidney and ureter, unspecified: Secondary | ICD-10-CM

## 2015-11-10 DIAGNOSIS — R4182 Altered mental status, unspecified: Secondary | ICD-10-CM | POA: Diagnosis present

## 2015-11-10 DIAGNOSIS — IMO0001 Reserved for inherently not codable concepts without codable children: Secondary | ICD-10-CM | POA: Insufficient documentation

## 2015-11-10 DIAGNOSIS — W06XXXA Fall from bed, initial encounter: Secondary | ICD-10-CM | POA: Diagnosis not present

## 2015-11-10 DIAGNOSIS — F329 Major depressive disorder, single episode, unspecified: Secondary | ICD-10-CM | POA: Diagnosis present

## 2015-11-10 DIAGNOSIS — E875 Hyperkalemia: Secondary | ICD-10-CM | POA: Diagnosis present

## 2015-11-10 DIAGNOSIS — B20 Human immunodeficiency virus [HIV] disease: Secondary | ICD-10-CM | POA: Diagnosis present

## 2015-11-10 DIAGNOSIS — R21 Rash and other nonspecific skin eruption: Secondary | ICD-10-CM | POA: Insufficient documentation

## 2015-11-10 DIAGNOSIS — M6282 Rhabdomyolysis: Secondary | ICD-10-CM | POA: Diagnosis present

## 2015-11-10 DIAGNOSIS — R410 Disorientation, unspecified: Secondary | ICD-10-CM | POA: Diagnosis not present

## 2015-11-10 DIAGNOSIS — K760 Fatty (change of) liver, not elsewhere classified: Secondary | ICD-10-CM | POA: Diagnosis present

## 2015-11-10 DIAGNOSIS — B1910 Unspecified viral hepatitis B without hepatic coma: Secondary | ICD-10-CM | POA: Diagnosis present

## 2015-11-10 DIAGNOSIS — G9341 Metabolic encephalopathy: Secondary | ICD-10-CM | POA: Diagnosis present

## 2015-11-10 DIAGNOSIS — Z818 Family history of other mental and behavioral disorders: Secondary | ICD-10-CM

## 2015-11-10 DIAGNOSIS — N251 Nephrogenic diabetes insipidus: Secondary | ICD-10-CM | POA: Diagnosis present

## 2015-11-10 DIAGNOSIS — G9340 Encephalopathy, unspecified: Secondary | ICD-10-CM | POA: Insufficient documentation

## 2015-11-10 DIAGNOSIS — E8729 Other acidosis: Secondary | ICD-10-CM | POA: Diagnosis present

## 2015-11-10 DIAGNOSIS — I129 Hypertensive chronic kidney disease with stage 1 through stage 4 chronic kidney disease, or unspecified chronic kidney disease: Secondary | ICD-10-CM | POA: Diagnosis present

## 2015-11-10 DIAGNOSIS — E43 Unspecified severe protein-calorie malnutrition: Secondary | ICD-10-CM | POA: Insufficient documentation

## 2015-11-10 DIAGNOSIS — E1100 Type 2 diabetes mellitus with hyperosmolarity without nonketotic hyperglycemic-hyperosmolar coma (NKHHC): Secondary | ICD-10-CM | POA: Diagnosis present

## 2015-11-10 DIAGNOSIS — Z794 Long term (current) use of insulin: Secondary | ICD-10-CM

## 2015-11-10 DIAGNOSIS — E860 Dehydration: Secondary | ICD-10-CM | POA: Diagnosis present

## 2015-11-10 DIAGNOSIS — R000 Tachycardia, unspecified: Secondary | ICD-10-CM | POA: Diagnosis present

## 2015-11-10 DIAGNOSIS — E1165 Type 2 diabetes mellitus with hyperglycemia: Secondary | ICD-10-CM

## 2015-11-10 HISTORY — DX: Depression, unspecified: F32.A

## 2015-11-10 HISTORY — DX: Acute kidney failure, unspecified: N17.9

## 2015-11-10 HISTORY — DX: Major depressive disorder, single episode, unspecified: F32.9

## 2015-11-10 LAB — GLUCOSE, CAPILLARY
GLUCOSE-CAPILLARY: 369 mg/dL — AB (ref 65–99)
GLUCOSE-CAPILLARY: 284 mg/dL — AB (ref 65–99)
GLUCOSE-CAPILLARY: 213 mg/dL — AB (ref 65–99)
GLUCOSE-CAPILLARY: 169 mg/dL — AB (ref 65–99)
Glucose-Capillary: 202 mg/dL — ABNORMAL HIGH (ref 65–99)

## 2015-11-10 LAB — COMPREHENSIVE METABOLIC PANEL
ALBUMIN: 4.7 g/dL (ref 3.5–5.0)
ALT: 111 U/L — ABNORMAL HIGH (ref 17–63)
ANION GAP: 23 — AB (ref 5–15)
AST: 60 U/L — AB (ref 15–41)
Alkaline Phosphatase: 140 U/L — ABNORMAL HIGH (ref 38–126)
BILIRUBIN TOTAL: 1.8 mg/dL — AB (ref 0.3–1.2)
BUN: 47 mg/dL — ABNORMAL HIGH (ref 6–20)
CHLORIDE: 104 mmol/L (ref 101–111)
CO2: 19 mmol/L — ABNORMAL LOW (ref 22–32)
Calcium: 10 mg/dL (ref 8.9–10.3)
Creatinine, Ser: 2.5 mg/dL — ABNORMAL HIGH (ref 0.61–1.24)
GFR calc Af Amer: 30 mL/min — ABNORMAL LOW (ref >60)
GFR, EST NON AFRICAN AMERICAN: 26 mL/min — AB (ref >60)
GLUCOSE: 1179 mg/dL — AB (ref 65–99)
POTASSIUM: 5.6 mmol/L — AB (ref 3.5–5.1)
Sodium: 146 mmol/L — ABNORMAL HIGH (ref 135–145)
Total Protein: 8.7 g/dL — ABNORMAL HIGH (ref 6.5–8.1)

## 2015-11-10 LAB — URINALYSIS, ROUTINE W REFLEX MICROSCOPIC
BILIRUBIN URINE: NEGATIVE
Ketones, ur: NEGATIVE mg/dL
Leukocytes, UA: NEGATIVE
Nitrite: NEGATIVE
PH: 5 (ref 5.0–8.0)
Protein, ur: 30 mg/dL — AB
SPECIFIC GRAVITY, URINE: 1.037 — AB (ref 1.005–1.030)

## 2015-11-10 LAB — BASIC METABOLIC PANEL
ANION GAP: 17 — AB (ref 5–15)
BUN: 45 mg/dL — AB (ref 6–20)
BUN: 46 mg/dL — AB (ref 6–20)
BUN: 46 mg/dL — AB (ref 6–20)
CALCIUM: 10.1 mg/dL (ref 8.9–10.3)
CHLORIDE: 115 mmol/L — AB (ref 101–111)
CO2: 19 mmol/L — ABNORMAL LOW (ref 22–32)
CO2: 22 mmol/L (ref 22–32)
CO2: 21 mmol/L — ABNORMAL LOW (ref 22–32)
CREATININE: 2.13 mg/dL — AB (ref 0.61–1.24)
Calcium: 9.1 mg/dL (ref 8.9–10.3)
Calcium: 10.5 mg/dL — ABNORMAL HIGH (ref 8.9–10.3)
Chloride: >130 mmol/L (ref 101–111)
Creatinine, Ser: 2.28 mg/dL — ABNORMAL HIGH (ref 0.61–1.24)
Creatinine, Ser: 2.02 mg/dL — ABNORMAL HIGH (ref 0.61–1.24)
GFR calc non Af Amer: 33 mL/min — ABNORMAL LOW (ref >60)
GFR calc non Af Amer: 31 mL/min — ABNORMAL LOW (ref >60)
GFR, EST AFRICAN AMERICAN: 33 mL/min — AB (ref >60)
GFR, EST AFRICAN AMERICAN: 39 mL/min — AB (ref >60)
GFR, EST AFRICAN AMERICAN: 36 mL/min — AB (ref >60)
GFR, EST NON AFRICAN AMERICAN: 29 mL/min — AB (ref >60)
Glucose, Bld: 943 mg/dL (ref 65–99)
Glucose, Bld: 305 mg/dL — ABNORMAL HIGH (ref 65–99)
Glucose, Bld: 185 mg/dL — ABNORMAL HIGH (ref 65–99)
POTASSIUM: 4.7 mmol/L (ref 3.5–5.1)
POTASSIUM: 3.8 mmol/L (ref 3.5–5.1)
Potassium: 3.9 mmol/L (ref 3.5–5.1)
SODIUM: 151 mmol/L — AB (ref 135–145)
SODIUM: 164 mmol/L — AB (ref 135–145)
SODIUM: 166 mmol/L — AB (ref 135–145)

## 2015-11-10 LAB — I-STAT CHEM 8, ED
BUN: 58 mg/dL — AB (ref 6–20)
CALCIUM ION: 1.14 mmol/L (ref 1.13–1.30)
Chloride: 110 mmol/L (ref 101–111)
Creatinine, Ser: 2 mg/dL — ABNORMAL HIGH (ref 0.61–1.24)
Glucose, Bld: >700 mg/dL (ref 65–99)
HEMATOCRIT: 60 % — AB (ref 39.0–52.0)
HEMOGLOBIN: 20.4 g/dL — AB (ref 13.0–17.0)
Potassium: 5.5 mmol/L — ABNORMAL HIGH (ref 3.5–5.1)
Sodium: 148 mmol/L — ABNORMAL HIGH (ref 135–145)
TCO2: 21 mmol/L (ref 0–100)

## 2015-11-10 LAB — BLOOD GAS, VENOUS
Acid-base deficit: 5.6 mmol/L — ABNORMAL HIGH (ref 0.0–2.0)
BICARBONATE: 20.8 meq/L (ref 20.0–24.0)
O2 SAT: 71.1 %
PO2 VEN: 42.6 mmHg (ref 31.0–45.0)
Patient temperature: 37
TCO2: 17.5 mmol/L (ref 0–100)
pCO2, Ven: 44.5 mmHg — ABNORMAL LOW (ref 45.0–50.0)
pH, Ven: 7.292 (ref 7.250–7.300)

## 2015-11-10 LAB — CBC WITH DIFFERENTIAL/PLATELET
Basophils Absolute: 0 10*3/uL (ref 0.0–0.1)
Basophils Relative: 0 %
EOS ABS: 0 10*3/uL (ref 0.0–0.7)
Eosinophils Relative: 0 %
HEMATOCRIT: 59.9 % — AB (ref 39.0–52.0)
HEMOGLOBIN: 20 g/dL — AB (ref 13.0–17.0)
LYMPHS ABS: 0.6 10*3/uL — AB (ref 0.7–4.0)
Lymphocytes Relative: 4 %
MCH: 33 pg (ref 26.0–34.0)
MCHC: 33.4 g/dL (ref 30.0–36.0)
MCV: 98.8 fL (ref 78.0–100.0)
MONOS PCT: 7 %
Monocytes Absolute: 0.9 10*3/uL (ref 0.1–1.0)
NEUTROS PCT: 88 %
Neutro Abs: 12.1 10*3/uL — ABNORMAL HIGH (ref 1.7–7.7)
Platelets: 171 10*3/uL (ref 150–400)
RBC: 6.06 MIL/uL — ABNORMAL HIGH (ref 4.22–5.81)
RDW: 12.9 % (ref 11.5–15.5)
WBC: 13.7 10*3/uL — ABNORMAL HIGH (ref 4.0–10.5)

## 2015-11-10 LAB — MRSA PCR SCREENING: MRSA BY PCR: NEGATIVE

## 2015-11-10 LAB — LIPASE, BLOOD: Lipase: 62 U/L — ABNORMAL HIGH (ref 11–51)

## 2015-11-10 LAB — RAPID URINE DRUG SCREEN, HOSP PERFORMED
Amphetamines: NOT DETECTED
BARBITURATES: NOT DETECTED
Benzodiazepines: NOT DETECTED
Cocaine: NOT DETECTED
Opiates: NOT DETECTED
TETRAHYDROCANNABINOL: NOT DETECTED

## 2015-11-10 LAB — CBG MONITORING, ED
GLUCOSE-CAPILLARY: 550 mg/dL — AB (ref 65–99)
Glucose-Capillary: >600 mg/dL (ref 65–99)
Glucose-Capillary: >600 mg/dL (ref 65–99)

## 2015-11-10 LAB — SALICYLATE LEVEL

## 2015-11-10 LAB — URINE MICROSCOPIC-ADD ON

## 2015-11-10 LAB — LITHIUM LEVEL: LITHIUM LVL: 0.17 mmol/L — AB (ref 0.60–1.20)

## 2015-11-10 LAB — ACETAMINOPHEN LEVEL: Acetaminophen (Tylenol), Serum: <10 ug/mL — ABNORMAL LOW (ref 10–30)

## 2015-11-10 LAB — ETHANOL

## 2015-11-10 MED ORDER — RISPERIDONE 1 MG PO TABS
1.0000 mg | ORAL_TABLET | Freq: Two times a day (BID) | ORAL | Status: DC
  Filled 2015-11-10 (×5): qty 1

## 2015-11-10 MED ORDER — BUSPIRONE HCL 15 MG PO TABS
15.0000 mg | ORAL_TABLET | Freq: Two times a day (BID) | ORAL | Status: DC
  Filled 2015-11-10 (×5): qty 1

## 2015-11-10 MED ORDER — MIRTAZAPINE 15 MG PO TABS: 15.0000 mg | ORAL_TABLET | Freq: Every day | ORAL | Status: DC

## 2015-11-10 MED ORDER — DEXTROSE-NACL 5-0.45 % IV SOLN
INTRAVENOUS | Status: DC
  Administered 2015-11-10: 20:00:00 via INTRAVENOUS

## 2015-11-10 MED ORDER — ONDANSETRON HCL 4 MG/2ML IJ SOLN: 4.0000 mg | Freq: Four times a day (QID) | INTRAMUSCULAR | Status: DC | PRN

## 2015-11-10 MED ORDER — LEVOTHYROXINE SODIUM 50 MCG PO TABS: 50.0000 ug | ORAL_TABLET | Freq: Every day | ORAL | Status: DC

## 2015-11-10 MED ORDER — SODIUM CHLORIDE 0.9 % IV SOLN
INTRAVENOUS | Status: DC
  Administered 2015-11-10: 18:00:00 via INTRAVENOUS

## 2015-11-10 MED ORDER — ONDANSETRON HCL 4 MG PO TABS: 4.0000 mg | ORAL_TABLET | Freq: Four times a day (QID) | ORAL | Status: DC | PRN

## 2015-11-10 MED ORDER — EMTRICITABINE-TENOFOVIR AF 200-25 MG PO TABS: 1.0000 | ORAL_TABLET | Freq: Every day | ORAL | Status: DC

## 2015-11-10 MED ORDER — EMTRICITABINE-TENOFOVIR AF 200-25 MG PO TABS
1.0000 | ORAL_TABLET | Freq: Every day | ORAL | Status: DC
  Filled 2015-11-10 (×3): qty 1

## 2015-11-10 MED ORDER — SODIUM CHLORIDE 0.9 % IV BOLUS (SEPSIS)
1000.0000 mL | Freq: Once | INTRAVENOUS | Status: AC
  Administered 2015-11-10: 1000 mL via INTRAVENOUS

## 2015-11-10 MED ORDER — DULOXETINE HCL 30 MG PO CPEP: 60.0000 mg | ORAL_CAPSULE | Freq: Every day | ORAL | Status: DC

## 2015-11-10 MED ORDER — SODIUM CHLORIDE 0.9 % IV SOLN
INTRAVENOUS | Status: DC
  Administered 2015-11-10: 14:00:00 via INTRAVENOUS

## 2015-11-10 MED ORDER — LORAZEPAM 0.5 MG PO TABS: 0.5000 mg | ORAL_TABLET | Freq: Three times a day (TID) | ORAL | Status: DC

## 2015-11-10 MED ORDER — SODIUM CHLORIDE 0.45 % IV SOLN
INTRAVENOUS | Status: DC
  Administered 2015-11-10 – 2015-11-11 (×3): via INTRAVENOUS

## 2015-11-10 MED ORDER — SODIUM CHLORIDE 0.9 % IV SOLN
INTRAVENOUS | Status: DC
  Administered 2015-11-10: 13:00:00 via INTRAVENOUS
  Filled 2015-11-10: qty 2.5

## 2015-11-10 MED ORDER — TEMAZEPAM 15 MG PO CAPS: 15.0000 mg | ORAL_CAPSULE | Freq: Every evening | ORAL | Status: DC | PRN

## 2015-11-10 MED ORDER — SODIUM CHLORIDE 0.9 % IV SOLN
INTRAVENOUS | Status: DC
  Administered 2015-11-10: 10.8 [IU]/h via INTRAVENOUS
  Filled 2015-11-10: qty 2.5

## 2015-11-10 MED ORDER — RALTEGRAVIR POTASSIUM 400 MG PO TABS
400.0000 mg | ORAL_TABLET | Freq: Two times a day (BID) | ORAL | Status: DC
  Administered 2015-11-12 – 2015-11-13 (×2): 400 mg via ORAL
  Filled 2015-11-10 (×7): qty 1

## 2015-11-10 MED ORDER — SODIUM CHLORIDE 0.9% FLUSH
3.0000 mL | Freq: Two times a day (BID) | INTRAVENOUS | Status: DC
  Administered 2015-11-10 – 2015-11-16 (×12): 3 mL via INTRAVENOUS

## 2015-11-10 MED ORDER — SODIUM CHLORIDE 0.9 % IV SOLN: INTRAVENOUS | Status: DC

## 2015-11-10 MED ORDER — DEXTROSE-NACL 5-0.45 % IV SOLN
INTRAVENOUS | Status: DC
Start: 2015-11-10 — End: 2015-11-10

## 2015-11-10 NOTE — ED Notes (Signed)
Pt may transfer to floor 1710.

## 2015-11-10 NOTE — ED Notes (Signed)
Pickering at bedside.  

## 2015-11-10 NOTE — ED Notes (Signed)
Found PT at end of bed pulling IV. Bed alarm have been place on PT bed

## 2015-11-10 NOTE — H&P (Signed)
[facility] History and Physical  Micheal Morales QIO:[id number] DOB: 08/04/1951 DOA: 11/10/2015  Referring physician: ER physician: Dr. Stephens November  PCP: pt has psychiatrist who he follows with   Chief Complaint: altered mental status   HPI:  65 year old male with past medical history of severe depression, hypothyroidism. He is drowsy and not able to provide details of HPI so his wife gave most of the history. Pt was depressed over past couple of weeks but could not see his psychiatrist so per his wife it is questionable if he accurately took all his meds. She says all he did for past week he ate sweets, drank sweet juices and stared at the TV. He has not eaten anything else. His mental status was much worse this am and she had to bring him in for evaluation. No respiratory distress. No vomiting. No fevers at home. No cough.  In ED, BP was 114/94, HR 120, RR 19-34, T max 99.1 F, oxygen saturation was 90-96% on room air. Blood wokr showed WBC count 13.7, sodium 146, potassium 5.6, CO2 19, creatinine 2.5. Glucose was 1179 and has trended down to 943. No ketones on UA but AG was 23. He was started on insulin drip.    Assessment & Plan    Principal Problem:   Acute encephalopathy - Probably from elevated CBG's - Lithium level is in normal range - UDS WNL - Alcohol leve is pending  - Normal tylenol and salicylate level - Monitor in SDU since he requires insulin drip  Active Problems:   AKI (acute kidney injury) ([place]) - Likely due to metabolic acidosis due to hyperglycemia, also dehydration - Continue IV fluids - F/U BMP in am    Uncontrolled type 2 DM with hyperosmolar nonketotic hyperglycemia ([facility]) / High anion gap metabolic acidosis - Follow up A1c on this admission - he does not have history of DM. Could potentially any of his meds contribute to hyperglycemia (AR, lithium) - Last A1c was 6.5 in 06/2015 - CBG on admission is more than 1179, glucose on BMP more than  700, no ketones on UA, anion gap 23 hyperosmolar hyperglycemia protocol - Follow up CBG's Q 1 hours and BMP every 4 hours  - Continue current IV fluids - Keep NPO - Started insulin drip per     HIV DISEASE - CD4 542 in 06/2015 - Continue ART: Isentress and descovy    History of hepatitis B / Abnormal LFT's - Stable - Has elevated LFT's. - The following is the LFT trend since 06/2014 Hepatic Function 11/10/2015 07/01/2015 06/25/2014  AST 60(H) 69(H) 43(H)  ALT 111(H) 124(H) 65(H)  Alk Phosphatase 140(H) 83 85  Total Bilirubin 1.8(H) 0.7 0.7  - recheck LFT's in am and if no improvement would order abd [place]    Depression - Resumed Buspar, Cymbalta, Risperdal, ativan  - Will need psych consult once his mental status better to discuss if current meds are adequate     Hyperkalemia - Corrected with insulin     Hypernatremia - Due to dehydration and hyperglycemia - Continue IV fluids - Follow up BMP in am    Hypothyroidism - Check TSH - Continue synthroid     DVT prophylaxis:  - SCD's bilaterally   Radiological Exams on Admission: Dg Chest Port 1 View 11/10/2015  Mild left lower lobe atelectasis Electronically Signed   By: Franchot Gallo M.D.   On: 11/10/2015 11:52    EKG: I have personally reviewed EKG. EKG shows sinus tachycardia  Code Status: Full Family Communication: Plan of care discussed with the patient's wife at the bedside  Disposition Plan: Admit for further evaluation, telemetry   DEVINE, Dedra Skeens, MD  [facility] Pager [phone]  Time spent in minutes: 75 minutes  Review of Systems:  Unable to obtain due to altered mental status   Past Medical History  Diagnosis Date  . HIV infection ([place])   . Hypertension   . Depression    History reviewed. No pertinent past surgical history. Social History:  reports that he has never smoked. He does not have any smokeless tobacco history on file. His alcohol and drug histories are not on file.  Allergies   Allergen Reactions  . Cefaclor     REACTION: rash    Family History: depression in family, parents    Prior to Admission medications   Medication Sig Start Date End Date Taking? Authorizing Provider  busPIRone (BUSPAR) 15 MG tablet Take 15 mg by mouth 2 (two) times daily. 09/09/15  Yes Historical Provider, MD  DULoxetine (CYMBALTA) 60 MG capsule Take 1 capsule (60 mg total) by mouth 2 (two) times daily. Prescribed by psychiatrist Patient taking differently: Take 60 mg by mouth daily. Prescribed by psychiatrist, confirmed with [facility], 12/10/14 11/22/14  Yes Truman Hayward, MD  ISENTRESS 400 MG tablet TAKE 1 TABLET BY MOUTH TWICE A DAY 01/21/15  Yes Truman Hayward, MD  levothyroxine (SYNTHROID, LEVOTHROID) 50 MCG tablet Take 50 mcg by mouth daily. 10/10/15  Yes Historical Provider, MD  lithium carbonate 300 MG capsule Take 1 capsule (300 mg total) by mouth 3 (three) times daily with meals. Patient taking differently: Take 900 mg by mouth at bedtime.  11/22/14  Yes Truman Hayward, MD  LORazepam (ATIVAN) 0.5 MG tablet Take 1 tablet (0.5 mg total) by mouth every 6 (six) hours as needed for anxiety. Patient taking differently: Take 0.5 mg by mouth 3 (three) times daily.  07/08/14  Yes Truman Hayward, MD  mirtazapine (REMERON) 15 MG tablet Take 15 mg by mouth at bedtime. 09/24/15  Yes Historical Provider, MD  TRUVADA 200-300 MG per tablet TAKE 1 TABLET BY MOUTH DAILY 01/21/15  Yes Truman Hayward, MD  risperiDONE (RISPERDAL) 1 MG tablet Take 1 mg by mouth 2 (two) times daily.    Historical Provider, MD  temazepam (RESTORIL) 15 MG capsule Take 15 mg by mouth at bedtime as needed. Prescribed by psychiatrist 12/06/11   Historical Provider, MD   Physical Exam: Filed Vitals:   11/10/15 1132 11/10/15 1236 11/10/15 1237 11/10/15 1330  BP: 130/83 153/92 114/94 132/90  Pulse: 123 122 121 122  Temp:   97.9 F (36.6 C) 97 F (36.1 C)  TempSrc:   Oral Axillary  Resp: '22 20 28 '$ 33   Height:  '5\' 8"'$  (1.727 m)    Weight:  79.379 kg (175 lb)    SpO2: 94% 94% 94% 91%    Physical Exam  Constitutional: Appears well-developed and well-nourished. No distress. Flushed in face HENT: Normocephalic. No tonsillar erythema or exudates Eyes: Conjunctivae are normal. No scleral icterus.  Neck: Normal ROM. Neck supple. No JVD. No tracheal deviation. No thyromegaly.  CVS: tachycardic, S1/S2 +, no murmurs, no gallops, no carotid bruit.  Pulmonary: Effort and breath sounds normal, no stridor, rhonchi, wheezes, rales.  Abdominal: Soft. BS +,  no distension, tenderness, rebound or guarding.  Musculoskeletal: Normal range of motion. No edema and no tenderness.  Lymphadenopathy: No lymphadenopathy noted,  cervical, inguinal. Neuro: Drowsy, opens eyes to verbal stimulation. No focal neurologic deficits. Skin: Skin is warm and dry other than facial flushing. No rash noted.  No erythema. No pallor.  Psychiatric:Unable to assess due to pt altered mental status   Labs on Admission:  Basic Metabolic Panel:  Recent Labs Lab 11/10/15 1104 11/10/15 1139  NA 146* 148*  K 5.6* 5.5*  CL 104 110  CO2 19*  --   GLUCOSE 1179* >700*  BUN 47* 58*  CREATININE 2.50* 2.00*  CALCIUM 10.0  --    Liver Function Tests:  Recent Labs Lab 11/10/15 1104  AST 60*  ALT 111*  ALKPHOS 140*  BILITOT 1.8*  PROT 8.7*  ALBUMIN 4.7    Recent Labs Lab 11/10/15 1104  LIPASE 62*   No results for input(s): AMMONIA in the last 168 hours. CBC:  Recent Labs Lab 11/10/15 1104 11/10/15 1139  WBC 13.7*  --   NEUTROABS 12.1*  --   HGB 20.0* 20.4*  HCT 59.9* 60.0*  MCV 98.8  --   PLT 171  --    Cardiac Enzymes: No results for input(s): CKTOTAL, CKMB, CKMBINDEX, TROPONINI in the last 168 hours. BNP: Invalid input(s): POCBNP CBG:  Recent Labs Lab 11/10/15 1030 11/10/15 1229 11/10/15 1337  GLUCAP >600* >600* >600*    If 7PM-7AM, please contact night-coverage [email or web address] Password  [email or web address] 11/10/2015, 1:41 PM

## 2015-11-10 NOTE — ED Notes (Signed)
Bed: RESA Expected date:  Expected time:  Means of arrival:  Comments: EMS- AMS x 24 hrs/hyperglycemia/medication change

## 2015-11-10 NOTE — ED Notes (Signed)
Delay in lab ordered related to pt transfer and DG.

## 2015-11-10 NOTE — Progress Notes (Addendum)
pcp is dr Forrest Moronstephen Ruehle EPIC updated

## 2015-11-10 NOTE — Progress Notes (Signed)
Inpatient Diabetes Program Recommendations  AACE/ADA: New Consensus Statement on Inpatient Glycemic Control (2015)  Target Ranges:  Prepandial:   less than 140 mg/dL      Peak postprandial:   less than 180 mg/dL (1-2 hours)      Critically ill patients:  140 - 180 mg/dL   Results for Micheal Morales, Micheal Morales (MRN [id number]) as of 11/10/2015 14:54  Ref. Range 11/10/2015 11:04  Sodium Latest Ref Range: 135-145 mmol/L 146 (H)  Potassium Latest Ref Range: 3.5-5.1 mmol/L 5.6 (H)  Chloride Latest Ref Range: 101-111 mmol/L 104  CO2 Latest Ref Range: 22-32 mmol/L 19 (L)  BUN Latest Ref Range: 6-20 mg/dL 47 (H)  Creatinine Latest Ref Range: 0.61-1.24 mg/dL 2.50 (H)  Calcium Latest Ref Range: 8.9-10.3 mg/dL 10.0  EGFR (Non-African Amer.) Latest Ref Range: >60 mL/min 26 (L)  EGFR (African American) Latest Ref Range: >60 mL/min 30 (L)  Glucose Latest Ref Range: 65-99 mg/dL 1179 (HH)  Anion gap Latest Ref Range: 5-15  23 (H)    Admit with: Hyperglycemia/ New diagnosis of DM  History: HIV, HTN  Current Insulin Orders: IV Insulin drip per DKA order set     -Note glucose 1179 mg/dl on admission.  Patient given IVF and started on IV insulin drip at 12:30pm.  -No previous History of DM mentioned in notes.  -Patient to be admitted to hospital.  Will need DM education once appropriate.      --Will follow patient during hospitalization--  Wyn Quaker RN, MSN, CDE Diabetes Coordinator Inpatient Glycemic Control Team Team Pager: [phone] (8a-5p)'

## 2015-11-10 NOTE — ED Notes (Signed)
Critical istat result given to EDP Rubin PayorPickering and RN

## 2015-11-10 NOTE — ED Provider Notes (Signed)
CSN: [id number]     Arrival date & time 11/10/15  1009 History   First MD Initiated Contact with Patient 11/10/15 1014     Chief Complaint  Patient presents with  . Hyperglycemia  . Altered Mental Status    Level V caveat due to altered mental status. Patient is a 65 y.o. male presenting with hyperglycemia and altered mental status. The history is provided by the patient and the spouse.  Hyperglycemia Associated symptoms: altered mental status   Associated symptoms: no increased thirst   Altered Mental Status Patient presents with altered mental status. More confused today and somewhat the last couple days. Found to have a high blood sugar by EMS. Patient cannot provide much history. Reportedly has had a change in some medications recently. Some medication been changed by his doctor but he did not think was working so he stopped it. He has had apparently worsening depression recently. He's been eating and drinking more. His been getting up at night to urinate more frequently.  Past Medical History  Diagnosis Date  . HIV infection (HCC)   . Hypertension   . Depression    History reviewed. No pertinent past surgical history. No family history on file. Social History  Substance Use Topics  . Smoking status: Never Smoker   . Smokeless tobacco: None  . Alcohol Use: None    Review of Systems  Unable to perform ROS: Mental status change  Endocrine: Negative for polydipsia.      Allergies  Cefaclor  Home Medications   Prior to Admission medications   Medication Sig Start Date End Date Taking? Authorizing Provider  busPIRone (BUSPAR) 15 MG tablet Take 15 mg by mouth 2 (two) times daily. 09/09/15  Yes Historical Provider, MD  DULoxetine (CYMBALTA) 60 MG capsule Take 1 capsule (60 mg total) by mouth 2 (two) times daily. Prescribed by psychiatrist Patient taking differently: Take 60 mg by mouth daily. Prescribed by psychiatrist, confirmed with [facility], 12/10/14 11/22/14  Yes Randall Hissornelius  N Van Dam, MD  ISENTRESS 400 MG tablet TAKE 1 TABLET BY MOUTH TWICE A DAY 01/21/15  Yes Randall Hissornelius N Van Dam, MD  levothyroxine (SYNTHROID, LEVOTHROID) 50 MCG tablet Take 50 mcg by mouth daily. 10/10/15  Yes Historical Provider, MD  lithium carbonate 300 MG capsule Take 1 capsule (300 mg total) by mouth 3 (three) times daily with meals. Patient taking differently: Take 900 mg by mouth at bedtime.  11/22/14  Yes Randall Hissornelius N Van Dam, MD  LORazepam (ATIVAN) 0.5 MG tablet Take 1 tablet (0.5 mg total) by mouth every 6 (six) hours as needed for anxiety. Patient taking differently: Take 0.5 mg by mouth 3 (three) times daily.  07/08/14  Yes Randall Hissornelius N Van Dam, MD  mirtazapine (REMERON) 15 MG tablet Take 15 mg by mouth at bedtime. 09/24/15  Yes Historical Provider, MD  TRUVADA 200-300 MG per tablet TAKE 1 TABLET BY MOUTH DAILY 01/21/15  Yes Randall Hissornelius N Van Dam, MD  risperiDONE (RISPERDAL) 1 MG tablet Take 1 mg by mouth 2 (two) times daily.    Historical Provider, MD  temazepam (RESTORIL) 15 MG capsule Take 15 mg by mouth at bedtime as needed. Prescribed by psychiatrist 12/06/11   Historical Provider, MD   BP 115/79 mmHg  Pulse 126  Temp(Src) 99.1 F (37.3 C) (Rectal)  Resp 28  Ht 5\' 8"  (1.727 m)  Wt 175 lb (79.379 kg)  BMI 26.61 kg/m2  SpO2 94% Physical Exam  Constitutional: He appears well-developed.  HENT:  Head: Atraumatic.  Mucous membranes are dry and lips are cracked.  Cardiovascular:  Moderate tachycardia  Pulmonary/Chest: Effort normal.  Abdominal: Soft. He exhibits no distension. There is no tenderness.  Musculoskeletal: He exhibits no edema.  Neurological:  Patient rests with his eyes closed. Will not really answer questions. Moves all extremities. Breathing spontaneously.  Skin: Skin is warm.    ED Course  Procedures (including critical care time) Labs Review Labs Reviewed  COMPREHENSIVE METABOLIC PANEL - Abnormal; Notable for the following:    Sodium 146 (*)    Potassium 5.6 (*)     CO2 19 (*)    Glucose, Bld 1179 (*)    BUN 47 (*)    Creatinine, Ser 2.50 (*)    Total Protein 8.7 (*)    AST 60 (*)    ALT 111 (*)    Alkaline Phosphatase 140 (*)    Total Bilirubin 1.8 (*)    GFR calc non Af Amer 26 (*)    GFR calc Af Amer 30 (*)    Anion gap 23 (*)    All other components within normal limits  LIPASE, BLOOD - Abnormal; Notable for the following:    Lipase 62 (*)    All other components within normal limits  BLOOD GAS, VENOUS - Abnormal; Notable for the following:    pCO2, Ven 44.5 (*)    Acid-base deficit 5.6 (*)    All other components within normal limits  CBC WITH DIFFERENTIAL/PLATELET - Abnormal; Notable for the following:    WBC 13.7 (*)    RBC 6.06 (*)    Hemoglobin 20.0 (*)    HCT 59.9 (*)    Neutro Abs 12.1 (*)    Lymphs Abs 0.6 (*)    All other components within normal limits  URINALYSIS, ROUTINE W REFLEX MICROSCOPIC (NOT AT [facility]) - Abnormal; Notable for the following:    Specific Gravity, Urine 1.037 (*)    Glucose, UA >1000 (*)    Hgb urine dipstick MODERATE (*)    Protein, ur 30 (*)    All other components within normal limits  ACETAMINOPHEN LEVEL - Abnormal; Notable for the following:    Acetaminophen (Tylenol), Serum <10 (*)    All other components within normal limits  LITHIUM LEVEL - Abnormal; Notable for the following:    Lithium Lvl 0.17 (*)    All other components within normal limits  URINE MICROSCOPIC-ADD ON - Abnormal; Notable for the following:    Squamous Epithelial / LPF 0-5 (*)    Bacteria, UA FEW (*)    Casts HYALINE CASTS (*)    All other components within normal limits  BASIC METABOLIC PANEL - Abnormal; Notable for the following:    Sodium 151 (*)    Chloride 115 (*)    CO2 19 (*)    Glucose, Bld 943 (*)    BUN 45 (*)    Creatinine, Ser 2.28 (*)    GFR calc non Af Amer 29 (*)    GFR calc Af Amer 33 (*)    Anion gap 17 (*)    All other components within normal limits  CBG MONITORING, ED - Abnormal; Notable for  the following:    Glucose-Capillary >600 (*)    All other components within normal limits  I-STAT CHEM 8, ED - Abnormal; Notable for the following:    Sodium 148 (*)    Potassium 5.5 (*)    BUN 58 (*)    Creatinine, Ser 2.00 (*)  Glucose, Bld >700 (*)    Hemoglobin 20.4 (*)    HCT 60.0 (*)    All other components within normal limits  CBG MONITORING, ED - Abnormal; Notable for the following:    Glucose-Capillary >600 (*)    All other components within normal limits  CBG MONITORING, ED - Abnormal; Notable for the following:    Glucose-Capillary >600 (*)    All other components within normal limits  CBG MONITORING, ED - Abnormal; Notable for the following:    Glucose-Capillary >600 (*)    All other components within normal limits  CBG MONITORING, ED - Abnormal; Notable for the following:    Glucose-Capillary >600 (*)    All other components within normal limits  URINE RAPID DRUG SCREEN, HOSP PERFORMED  SALICYLATE LEVEL  T-HELPER CELLS (CD4) COUNT (NOT AT [facility])  HIV 1 RNA QUANT-NO REFLEX-BLD  BASIC METABOLIC PANEL  BASIC METABOLIC PANEL  BASIC METABOLIC PANEL  HEMOGLOBIN A1C    Imaging Review Dg Chest Port 1 View  11/10/2015  CLINICAL DATA:  Weakness.  Hyperglycemia and altered mental status. EXAM: PORTABLE CHEST 1 VIEW COMPARISON:  None. FINDINGS: Elevated left hemidiaphragm with mild left lower lobe atelectasis. Negative for heart failure or pneumonia. No pleural effusion. IMPRESSION: Mild left lower lobe atelectasis Electronically Signed   By: Marlan Palau M.D.   On: 11/10/2015 11:52   I have personally reviewed and evaluated these images and lab results as part of my medical decision-making.   EKG Interpretation   Date/Time:  Monday November 10 2015 10:25:13 EDT Ventricular Rate:  122 PR Interval:  138 QRS Duration: 126 QT Interval:  349 QTC Calculation: 497 R Axis:   162 Text Interpretation:  Sinus tachycardia RBBB and LPFB Inferior infarct,  old Confirmed by  Erikah Thumm  MD, Marylu Dudenhoeffer [phone]) on 11/10/2015 10:33:36 AM      MDM   Final diagnoses:  Hyperosmolar (nonketotic) coma (HCC)  Renal insufficiency    Patient with altered mental status. Found to be hyperglycemic. Renal insufficiency. Started on glucose drip. Admit to stepdown bed.   CRITICAL CARE Performed by: Billee Cashing Total critical care time: 30 minutes Critical care time was exclusive of separately billable procedures and treating other patients. Critical care was necessary to treat or prevent imminent or life-threatening deterioration. Critical care was time spent personally by me on the following activities: development of treatment plan with patient and/or surrogate as well as nursing, discussions with consultants, evaluation of patient's response to treatment, examination of patient, obtaining history from patient or surrogate, ordering and performing treatments and interventions, ordering and review of laboratory studies, ordering and review of radiographic studies, pulse oximetry and re-evaluation of patient's condition.     Benjiman Core, MD 11/10/15 [phone]

## 2015-11-10 NOTE — ED Notes (Signed)
Pt EMS pt wife called EMS for pt altered mental status onset 0800 yesterday; pt CBG read high with EMS; no hx of diabetes. Recent medication change; unknown.

## 2015-11-11 ENCOUNTER — Inpatient Hospital Stay (HOSPITAL_COMMUNITY): Payer: BLUE CROSS/BLUE SHIELD

## 2015-11-11 DIAGNOSIS — Z794 Long term (current) use of insulin: Secondary | ICD-10-CM

## 2015-11-11 DIAGNOSIS — E1101 Type 2 diabetes mellitus with hyperosmolarity with coma: Secondary | ICD-10-CM

## 2015-11-11 DIAGNOSIS — E43 Unspecified severe protein-calorie malnutrition: Secondary | ICD-10-CM | POA: Insufficient documentation

## 2015-11-11 DIAGNOSIS — E1165 Type 2 diabetes mellitus with hyperglycemia: Secondary | ICD-10-CM

## 2015-11-11 DIAGNOSIS — IMO0001 Reserved for inherently not codable concepts without codable children: Secondary | ICD-10-CM | POA: Insufficient documentation

## 2015-11-11 LAB — BASIC METABOLIC PANEL
ANION GAP: 12 (ref 5–15)
ANION GAP: 12 (ref 5–15)
Anion gap: 14 (ref 5–15)
Anion gap: 18 — ABNORMAL HIGH (ref 5–15)
BUN: 50 mg/dL — AB (ref 6–20)
BUN: 63 mg/dL — ABNORMAL HIGH (ref 6–20)
BUN: 68 mg/dL — ABNORMAL HIGH (ref 6–20)
BUN: 66 mg/dL — ABNORMAL HIGH (ref 6–20)
BUN: 66 mg/dL — ABNORMAL HIGH (ref 6–20)
CHLORIDE: 121 mmol/L — AB (ref 101–111)
CHLORIDE: 129 mmol/L — AB (ref 101–111)
CHLORIDE: 130 mmol/L — AB (ref 101–111)
CO2: 20 mmol/L — ABNORMAL LOW (ref 22–32)
CO2: 19 mmol/L — AB (ref 22–32)
CO2: 20 mmol/L — AB (ref 22–32)
CO2: 23 mmol/L (ref 22–32)
CO2: 24 mmol/L (ref 22–32)
CREATININE: 2.33 mg/dL — AB (ref 0.61–1.24)
CREATININE: 3.04 mg/dL — AB (ref 0.61–1.24)
CREATININE: 2.88 mg/dL — AB (ref 0.61–1.24)
Calcium: 9.4 mg/dL (ref 8.9–10.3)
Calcium: 8.6 mg/dL — ABNORMAL LOW (ref 8.9–10.3)
Calcium: 8.3 mg/dL — ABNORMAL LOW (ref 8.9–10.3)
Calcium: 8.6 mg/dL — ABNORMAL LOW (ref 8.9–10.3)
Calcium: 8.3 mg/dL — ABNORMAL LOW (ref 8.9–10.3)
Chloride: 129 mmol/L — ABNORMAL HIGH (ref 101–111)
Creatinine, Ser: 2.89 mg/dL — ABNORMAL HIGH (ref 0.61–1.24)
Creatinine, Ser: 2.79 mg/dL — ABNORMAL HIGH (ref 0.61–1.24)
GFR calc non Af Amer: 20 mL/min — ABNORMAL LOW (ref >60)
GFR calc non Af Amer: 22 mL/min — ABNORMAL LOW (ref >60)
GFR calc non Af Amer: 22 mL/min — ABNORMAL LOW (ref >60)
GFR calc non Af Amer: 23 mL/min — ABNORMAL LOW (ref >60)
GFR, EST AFRICAN AMERICAN: 33 mL/min — AB (ref >60)
GFR, EST AFRICAN AMERICAN: 24 mL/min — AB (ref >60)
GFR, EST AFRICAN AMERICAN: 25 mL/min — AB (ref >60)
GFR, EST AFRICAN AMERICAN: 25 mL/min — AB (ref >60)
GFR, EST AFRICAN AMERICAN: 26 mL/min — AB (ref >60)
GFR, EST NON AFRICAN AMERICAN: 28 mL/min — AB (ref >60)
Glucose, Bld: 264 mg/dL — ABNORMAL HIGH (ref 65–99)
Glucose, Bld: 608 mg/dL (ref 65–99)
Glucose, Bld: 536 mg/dL — ABNORMAL HIGH (ref 65–99)
Glucose, Bld: 248 mg/dL — ABNORMAL HIGH (ref 65–99)
Glucose, Bld: 175 mg/dL — ABNORMAL HIGH (ref 65–99)
POTASSIUM: 3.9 mmol/L (ref 3.5–5.1)
POTASSIUM: 5.3 mmol/L — AB (ref 3.5–5.1)
POTASSIUM: 3.8 mmol/L (ref 3.5–5.1)
POTASSIUM: 3.9 mmol/L (ref 3.5–5.1)
Potassium: 4 mmol/L (ref 3.5–5.1)
SODIUM: 163 mmol/L — AB (ref 135–145)
SODIUM: 167 mmol/L — AB (ref 135–145)
Sodium: 158 mmol/L — ABNORMAL HIGH (ref 135–145)
Sodium: 161 mmol/L (ref 135–145)
Sodium: 165 mmol/L (ref 135–145)

## 2015-11-11 LAB — GLUCOSE, CAPILLARY
GLUCOSE-CAPILLARY: 125 mg/dL — AB (ref 65–99)
GLUCOSE-CAPILLARY: 142 mg/dL — AB (ref 65–99)
GLUCOSE-CAPILLARY: 174 mg/dL — AB (ref 65–99)
GLUCOSE-CAPILLARY: 205 mg/dL — AB (ref 65–99)
GLUCOSE-CAPILLARY: 212 mg/dL — AB (ref 65–99)
GLUCOSE-CAPILLARY: 266 mg/dL — AB (ref 65–99)
GLUCOSE-CAPILLARY: 391 mg/dL — AB (ref 65–99)
GLUCOSE-CAPILLARY: 496 mg/dL — AB (ref 65–99)
GLUCOSE-CAPILLARY: 521 mg/dL — AB (ref 65–99)
GLUCOSE-CAPILLARY: 587 mg/dL — AB (ref 65–99)
Glucose-Capillary: 128 mg/dL — ABNORMAL HIGH (ref 65–99)
Glucose-Capillary: >600 mg/dL (ref 65–99)
Glucose-Capillary: 465 mg/dL — ABNORMAL HIGH (ref 65–99)
Glucose-Capillary: 166 mg/dL — ABNORMAL HIGH (ref 65–99)
Glucose-Capillary: 154 mg/dL — ABNORMAL HIGH (ref 65–99)
Glucose-Capillary: 161 mg/dL — ABNORMAL HIGH (ref 65–99)

## 2015-11-11 LAB — CBC
HEMATOCRIT: 57.2 % — AB (ref 39.0–52.0)
HEMOGLOBIN: 18.9 g/dL — AB (ref 13.0–17.0)
MCH: 32.8 pg (ref 26.0–34.0)
MCHC: 33 g/dL (ref 30.0–36.0)
MCV: 99.1 fL (ref 78.0–100.0)
Platelets: 168 10*3/uL (ref 150–400)
RBC: 5.77 MIL/uL (ref 4.22–5.81)
RDW: 13.2 % (ref 11.5–15.5)
WBC: 21.3 10*3/uL — AB (ref 4.0–10.5)

## 2015-11-11 LAB — HIV-1 RNA QUANT-NO REFLEX-BLD
HIV 1 RNA Quant: <20 copies/mL
LOG10 HIV-1 RNA: UNDETERMINED {Log_copies}/mL

## 2015-11-11 LAB — CK: CK TOTAL: 4496 U/L — AB (ref 49–397)

## 2015-11-11 LAB — HEMOGLOBIN A1C
HEMOGLOBIN A1C: 11.7 % — AB (ref 4.8–5.6)
Mean Plasma Glucose: 289 mg/dL

## 2015-11-11 LAB — TSH: TSH: 1.927 u[IU]/mL (ref 0.350–4.500)

## 2015-11-11 LAB — T-HELPER CELLS (CD4) COUNT (NOT AT ARMC)
CD4 % Helper T Cell: 50 % (ref 33–55)
CD4 T CELL ABS: 300 /uL — AB (ref 400–2700)

## 2015-11-11 MED ORDER — LORAZEPAM 2 MG/ML IJ SOLN
0.5000 mg | Freq: Two times a day (BID) | INTRAMUSCULAR | Status: DC
  Administered 2015-11-11 – 2015-11-12 (×2): 0.5 mg via INTRAVENOUS
  Filled 2015-11-11 (×2): qty 1

## 2015-11-11 MED ORDER — LIP MEDEX EX OINT
TOPICAL_OINTMENT | CUTANEOUS | Status: AC
Start: 2015-11-11 — End: 2015-11-11
  Administered 2015-11-11: 05:00:00
  Filled 2015-11-11: qty 7

## 2015-11-11 MED ORDER — INSULIN ASPART 100 UNIT/ML ~~LOC~~ SOLN: 0.0000 [IU] | Freq: Three times a day (TID) | SUBCUTANEOUS | Status: DC

## 2015-11-11 MED ORDER — HALOPERIDOL LACTATE 5 MG/ML IJ SOLN
5.0000 mg | Freq: Four times a day (QID) | INTRAMUSCULAR | Status: DC | PRN
Start: 2015-11-11 — End: 2015-11-13
  Administered 2015-11-11 – 2015-11-13 (×4): 5 mg via INTRAVENOUS
  Filled 2015-11-11 (×4): qty 1

## 2015-11-11 MED ORDER — INSULIN REGULAR HUMAN 100 UNIT/ML IJ SOLN
INTRAMUSCULAR | Status: DC
  Administered 2015-11-11: 4.4 [IU]/h via INTRAVENOUS
  Filled 2015-11-11 (×2): qty 2.5

## 2015-11-11 MED ORDER — POTASSIUM CHLORIDE 10 MEQ/100ML IV SOLN: 10.0000 meq | INTRAVENOUS | Status: DC

## 2015-11-11 MED ORDER — SODIUM CHLORIDE 0.9 % IV SOLN
INTRAVENOUS | Status: DC
  Administered 2015-11-11: 12:00:00 via INTRAVENOUS

## 2015-11-11 MED ORDER — LEVOTHYROXINE SODIUM 100 MCG IV SOLR
25.0000 ug | Freq: Every day | INTRAVENOUS | Status: DC
  Administered 2015-11-11 – 2015-11-14 (×4): 25 ug via INTRAVENOUS
  Filled 2015-11-11 (×4): qty 5

## 2015-11-11 MED ORDER — DEXTROSE-NACL 5-0.45 % IV SOLN
INTRAVENOUS | Status: DC
  Administered 2015-11-11: 18:00:00 via INTRAVENOUS

## 2015-11-11 MED ORDER — SODIUM CHLORIDE 0.9 % IV SOLN
INTRAVENOUS | Status: DC
  Administered 2015-11-11: 10:00:00 via INTRAVENOUS

## 2015-11-11 MED ORDER — INSULIN GLARGINE 100 UNIT/ML ~~LOC~~ SOLN
10.0000 [IU] | Freq: Every day | SUBCUTANEOUS | Status: DC
  Administered 2015-11-11: 10 [IU] via SUBCUTANEOUS
  Filled 2015-11-11: qty 0.1

## 2015-11-11 MED ORDER — DEXTROSE 5 % IV SOLN
INTRAVENOUS | Status: DC
  Administered 2015-11-11 – 2015-11-14 (×12): via INTRAVENOUS

## 2015-11-11 NOTE — Progress Notes (Signed)
PROGRESS NOTE  Micheal QuarryMichael Morales ZOX:[id number] DOB: 05-27-1951 DOA: 11/10/2015 PCP: Micheal Morales Interim History 65 year old male with a history of HIV, diabetes mellitus type 2, depression, hypothyroidism, hypertension presents with increasing somnolence. The patient is encephalopathic and is unable to provide any history. As per his wife and medical record review, the patient has been very apathetic and depressed in the past several weeks. According to his wife, the patient was noncompliant with his diabetic diet and has missed follow-up appointments with his family doctor and psychiatrist. Because of increasing somnolence, the patient was brought to the emergency department. He was found to have a serum glucose 1179 with an anion gap of 14. He was initially started on intravenous fluids and intravenous insulin. After a brief transition to subcutaneous insulin, the patient redeveloped a gapped metabolic acidosis prompting reinitiation of intravenous fluids and intravenous insulin on the morning of 11/11/2015. Assessment/Plan: Acute metabolic encephalopathy -Multifactorial including DKA, hypernatremia, AKI -Aggressively treat metabolic abnormalities as discussed below -Urine drug screen negative -Urinalysis negative for pyuria -Chest x-ray without infiltrates -TSH--1.927  DKA -On the morning of 11/11/2015--patient redeveloped DKA with serum glucose 608 and anion gap 18 -Reinitiate intravenous insulin and intravenous fluids -11/10/2015 hemoglobin A1c--11.7 -Patient has been noncompliant with his medications -Transition to subcutaneous insulin once anion gap closes  Hypernatremia -Continue hypotonic saline -Gradually improving  Acute kidney injury -Renal ultrasound -Likely secondary to volume depletion -If continues to worsen, will need renal adjustment of his medications -cpk  HIV  -Recheck CD4 and HIV are negative  -07/01/2015 CD4 542 and HIV RNA undetectable    -Continue Isentress and Descovy  Hypothyroidism  -Converted to IV Synthroid until reliably able to take po  Depression/anxiety -Continue BuSpar, Cymbalta, mirtazapine, lorazepam   Transaminasemia -Abd ultrasound to also evaluate kidneys -he is hep B immune -hep c antibody   Family Communication:   No family at beside Disposition Plan:   Home 2-3 days       Procedures/Studies: Dg Chest Port 1 View  11/10/2015  CLINICAL DATA:  Weakness.  Hyperglycemia and altered mental status. EXAM: PORTABLE CHEST 1 VIEW COMPARISON:  None. FINDINGS: Elevated left hemidiaphragm with mild left lower lobe atelectasis. Negative for heart failure or pneumonia. No pleural effusion. IMPRESSION: Mild left lower lobe atelectasis Electronically Signed   By: Marlan Palauharles  Clark M.D.   On: 11/10/2015 11:52         Subjective:  patient is agitated and aggressive when examined. He is confused. Reassess in some obtainable secondary to acute encephalopathy. No reports of respiratory distress, uncontrolled pain vomiting, diarrhea  Objective: Filed Vitals:   11/11/15 0200 11/11/15 0400 11/11/15 0500 11/11/15 0802  BP: 130/88 155/104 134/92   Pulse: 135 134 131   Temp:  98 F (36.7 C)  98.7 F (37.1 C)  TempSrc:  Oral  Oral  Resp: 22 21 18    Height:      Weight:   74.4 kg (164 lb 0.4 oz)   SpO2: 92% 91% 94%     Intake/Output Summary (Last 24 hours) at 11/11/15 1144 Last data filed at 11/11/15 0503  Gross per 24 hour  Intake 256.23 ml  Output    500 ml  Net -243.77 ml   Weight change:  Exam:   General:  Pt is alert, follows commands appropriately, not in acute distress  HEENT: No icterus, No thrush, No neck mass, [place]/AT  Cardiovascular: RRR, S1/S2, no rubs, no gallops  Respiratory: Bibasilar  rales without wheezing   Abdomen: Soft/+BS, diffusely tender, non distended, no guarding; no hepatosplenomegaly   Extremities: No edema, No lymphangitis, No petechiae, No rashes, no synovitis; no  cyanosis   Data Reviewed: Basic Metabolic Panel:  Recent Labs Lab 11/10/15 1339 11/10/15 1859 11/10/15 2238 11/11/15 0307 11/11/15 0959  NA 151* 164* 166* 163* 158*  K 4.7 3.8 3.9 3.9 5.3*  CL 115* >130* >130* 129* 121*  CO2 19* 22 21* 20* 19*  GLUCOSE 943* 305* 185* 264* 608*  BUN 45* 46* 46* 50* 63*  CREATININE 2.28* 2.02* 2.13* 2.33* 3.04*  CALCIUM 9.1 10.5* 10.1 9.4 8.6*   Liver Function Tests:  Recent Labs Lab 11/10/15 1104  AST 60*  ALT 111*  ALKPHOS 140*  BILITOT 1.8*  PROT 8.7*  ALBUMIN 4.7    Recent Labs Lab 11/10/15 1104  LIPASE 62*   No results for input(s): AMMONIA in the last 168 hours. CBC:  Recent Labs Lab 11/10/15 1104 11/10/15 1139 11/11/15 0307  WBC 13.7*  --  21.3*  NEUTROABS 12.1*  --   --   HGB 20.0* 20.4* 18.9*  HCT 59.9* 60.0* 57.2*  MCV 98.8  --  99.1  PLT 171  --  168   Cardiac Enzymes: No results for input(s): CKTOTAL, CKMB, CKMBINDEX, TROPONINI in the last 168 hours. BNP: Invalid input(s): POCBNP CBG:  Recent Labs Lab 11/10/15 2352 11/11/15 0044 11/11/15 0153 11/11/15 0851 11/11/15 1108  GLUCAP 205* 128* 125* 496* 521*    Recent Results (from the past 240 hour(s))  MRSA PCR Screening     Status: None   Collection Time: 11/10/15  5:25 PM  Result Value Ref Range Status   MRSA by PCR NEGATIVE NEGATIVE Final    Comment:        The GeneXpert MRSA Assay (FDA approved for NASAL specimens only), is one component of a comprehensive MRSA colonization surveillance program. It is not intended to diagnose MRSA infection nor to guide or monitor treatment for MRSA infections.      Scheduled Meds: . busPIRone  15 mg Oral BID  . DULoxetine  60 mg Oral Daily  . emtricitabine-tenofovir AF  1 tablet Oral Daily  . levothyroxine  50 mcg Oral QAC breakfast  . LORazepam  0.5 mg Oral TID  . mirtazapine  15 mg Oral QHS  . raltegravir  400 mg Oral BID  . risperiDONE  1 mg Oral BID  . sodium chloride flush  3 mL  Intravenous Q12H   Continuous Infusions: . sodium chloride 125 mL/hr at 11/11/15 0503  . sodium chloride    . dextrose 5 % and 0.45% NaCl    . insulin (NOVOLIN-R) infusion 4.4 Units/hr (11/11/15 0957)     Srihan Brutus, DO  [facility] Pager [phone]  If 7PM-7AM, please contact night-coverage [email or web address] Password [email or web address] 11/11/2015, 11:44 AM   LOS: 1 day

## 2015-11-11 NOTE — Progress Notes (Signed)
CRITICAL VALUE ALERT  Critical value received:  Na+ 161  Date of notification:  3/21  Time of notification:  1455  Critical value read back:Yes.    Nurse who received alert:  Heloise PurpuraSusan Raynaldo Falco rn  MD notified (1st page):  Dr Tat  Time of first page:  1505  MD notified (2nd page):  Time of second page:  Responding MD:  Dr Arbutus Leasat  Time MD responded:  [phone]

## 2015-11-11 NOTE — Progress Notes (Signed)
CRITICAL VALUE ALERT  Critical value received:  Na+ 165  Date of notification:  11/11/15  Time of notification:  1830  Critical value read back:Yes.    Nurse who received alert:  Heloise PurpuraSusan Kaelea Gathright RN  MD notified (1st page): Dr Tat  Time of first page:  1832  MD notified (2nd page):  Time of second page:  Responding MD:  Dr Arbutus Leasat  Time MD responded:  [phone]

## 2015-11-11 NOTE — Progress Notes (Signed)
Inpatient Diabetes Program Recommendations  AACE/ADA: New Consensus Statement on Inpatient Glycemic Control (2015)  Target Ranges:  Prepandial:   less than 140 mg/dL      Peak postprandial:   less than 180 mg/dL (1-2 hours)      Critically ill patients:  140 - 180 mg/dL   Results for Lorenso QuarryRKER, [place] (MRN [id number]) as of 11/11/2015 08:34  Ref. Range 11/10/2015 13:39  Hemoglobin A1C Latest Ref Range: 4.8-5.6 % 11.7 (H)    Admit with: Hyperglycemia/ New diagnosis of DM  History: HIV, HTN  Current Insulin Orders: Lantus 10 units QHS        Novolog Sensitive Correction Scale/ SSI (0-9 units) TID AC     -Note glucose 1179 mg/dl on admission. Patient given IVF and started on IV insulin drip at 12:30pm yesterday afternoon.  Patient given 10 units Lantus at 2:45 AM today and IV insulin drip stopped.  -Glucose increased to 618 mg/dl this AM with an Anion Gap of 18.  IV insulin drip restarted at 10am today per DKA orders.  -No previous History of DM mentioned in notes.  -Will need DM education once appropriate.  Note patient agitated and confused this AM.  MD placed orders for Haldol prn and Posey restraint.  -Will initiate DM education orders once patient alert and oriented.     --Will follow patient during hospitalization--  Ambrose FinlandJeannine Johnston Selah Zelman RN, MSN, CDE Diabetes Coordinator Inpatient Glycemic Control Team Team Pager: [phone] (8a-5p)

## 2015-11-11 NOTE — Progress Notes (Addendum)
Na continues to be high despite D5 1/2NS  -start D5W only at 125 cc hr -keep on insulin drip until next BMP -if gap <12-->give LANTUS 30 UNITS DAILY AND CONTINUE D5W -SINCE PT NOT EATING-->WOULD CHECK SLIDING SCALE Q 4HOURS AFTER GAP CLOSES  Doubt diabetes insipidus as pt does not have polyuria, but if no improvement in Na with Free water (D5), may consider renal eval as pt has taken lithium  DTat

## 2015-11-11 NOTE — Progress Notes (Signed)
Initial Nutrition Assessment  DOCUMENTATION CODES:   Severe malnutrition in context of acute illness/injury  INTERVENTION:  -Diet advancement per MD -Upon diet advancement, provide Glucerna TID.  -RD to f/u to provide diabetes diet education.   NUTRITION DIAGNOSIS:   Malnutrition related to acute illness as evidenced by percent weight loss, energy intake < or equal to 50% for > or equal to 5 days.  GOAL:   Patient will meet greater than or equal to 90% of their needs  MONITOR:   PO intake, Supplement acceptance, Labs, Weight trends, I & O's, Diet advancement  REASON FOR ASSESSMENT:   Malnutrition Screening Tool    ASSESSMENT:   65 year old male with a history of HIV, diabetes mellitus type 2, depression, hypothyroidism, hypertension presents with increasing somnolence. The patient is encephalopathic and is unable to provide any history. As per his wife and medical record review, the patient has been very apathetic and depressed in the past several weeks. According to his wife, the patient was noncompliant with his diabetic diet and has missed follow-up appointments with his family doctor and psychiatrist. Because of increasing somnolence, the patient was brought to the emergency department. He was found to have a serum glucose 1179 with an anion gap of 14. He was initially started on intravenous fluids and intravenous insulin. After a brief transition to subcutaneous insulin, the patient redeveloped a gapped metabolic acidosis prompting reinitiation of intravenous fluids and intravenous insulin on the morning of 11/11/2015.  Pt asleep during visit. Pt's wife and daughter at bedside. Hx obtained from family. Pt has not been eating much for the past 1.5 weeks d/t poor appetite. Pt has been experiencing nausea when eating and vomiting after eating. Daughter reports pt eating fruit and 2 milkshakes on Sunday. Wife reports pt's normal intake of Nutrigrain bar and fruit for breakfast and a  healthy, balanced dinner prior to decreased appetite.   Wife reports pt's UBW of 185 lbs and current weight is 164 lbs. Pt has experienced a 12% weight loss in the past 3 weeks d/t nausea, vomiting, and poor intake. According to chart, pt weighed 202 lbs on 07/01/2015. Pt has experienced a 19% weight loss in the past 5 months which is significant for time frame. Unable to perform NFPE d/t pt's irritability.   Family is interested in diabetes diet education because of pt's recent diagnosis of T2DM. RD to monitor diet advancement and will provide Glucerna TID.   Labs: CBGs 521-600+, elevated Na and K, A1C 11.7  Meds reviewed.   Diet Order:  Diet NPO time specified  Skin:  Reviewed, no issues  Last BM:  3/20  Height:   Ht Readings from Last 1 Encounters:  11/10/15 5\' 5"  (1.651 m)    Weight:   Wt Readings from Last 1 Encounters:  11/11/15 164 lb 0.4 oz (74.4 kg)    Ideal Body Weight:  61.8 kg  BMI:  Body mass index is 27.29 kg/(m^2).  Estimated Nutritional Needs:   Kcal:  2000-2200  Protein:  110-120 g  Fluid:  2.0-2.2 L  EDUCATION NEEDS:   Education needs addressed (Will provide Diabetes edu prior to discharge)  Beryle QuantMeredith Sotero Brinkmeyer, MS [facility] Dietetic Intern Pager [phone]

## 2015-11-12 ENCOUNTER — Inpatient Hospital Stay (HOSPITAL_COMMUNITY): Payer: BLUE CROSS/BLUE SHIELD

## 2015-11-12 ENCOUNTER — Encounter (HOSPITAL_COMMUNITY): Payer: Self-pay | Admitting: Radiology

## 2015-11-12 DIAGNOSIS — G9340 Encephalopathy, unspecified: Secondary | ICD-10-CM

## 2015-11-12 DIAGNOSIS — B20 Human immunodeficiency virus [HIV] disease: Secondary | ICD-10-CM

## 2015-11-12 DIAGNOSIS — E872 Acidosis: Secondary | ICD-10-CM

## 2015-11-12 DIAGNOSIS — E1100 Type 2 diabetes mellitus with hyperosmolarity without nonketotic hyperglycemic-hyperosmolar coma (NKHHC): Secondary | ICD-10-CM

## 2015-11-12 DIAGNOSIS — E878 Other disorders of electrolyte and fluid balance, not elsewhere classified: Secondary | ICD-10-CM

## 2015-11-12 LAB — GLUCOSE, CAPILLARY
GLUCOSE-CAPILLARY: 123 mg/dL — AB (ref 65–99)
GLUCOSE-CAPILLARY: 125 mg/dL — AB (ref 65–99)
GLUCOSE-CAPILLARY: 136 mg/dL — AB (ref 65–99)
GLUCOSE-CAPILLARY: 148 mg/dL — AB (ref 65–99)
GLUCOSE-CAPILLARY: 152 mg/dL — AB (ref 65–99)
GLUCOSE-CAPILLARY: 154 mg/dL — AB (ref 65–99)
GLUCOSE-CAPILLARY: 159 mg/dL — AB (ref 65–99)
GLUCOSE-CAPILLARY: 181 mg/dL — AB (ref 65–99)
GLUCOSE-CAPILLARY: 186 mg/dL — AB (ref 65–99)
GLUCOSE-CAPILLARY: 197 mg/dL — AB (ref 65–99)
GLUCOSE-CAPILLARY: 218 mg/dL — AB (ref 65–99)
Glucose-Capillary: 137 mg/dL — ABNORMAL HIGH (ref 65–99)
Glucose-Capillary: 148 mg/dL — ABNORMAL HIGH (ref 65–99)
Glucose-Capillary: 123 mg/dL — ABNORMAL HIGH (ref 65–99)
Glucose-Capillary: 169 mg/dL — ABNORMAL HIGH (ref 65–99)
Glucose-Capillary: 197 mg/dL — ABNORMAL HIGH (ref 65–99)
Glucose-Capillary: 199 mg/dL — ABNORMAL HIGH (ref 65–99)
Glucose-Capillary: 156 mg/dL — ABNORMAL HIGH (ref 65–99)
Glucose-Capillary: 159 mg/dL — ABNORMAL HIGH (ref 65–99)
Glucose-Capillary: 158 mg/dL — ABNORMAL HIGH (ref 65–99)
Glucose-Capillary: 166 mg/dL — ABNORMAL HIGH (ref 65–99)

## 2015-11-12 LAB — BASIC METABOLIC PANEL
ANION GAP: 12 (ref 5–15)
ANION GAP: 12 (ref 5–15)
Anion gap: 10 (ref 5–15)
BUN: 64 mg/dL — ABNORMAL HIGH (ref 6–20)
BUN: 62 mg/dL — ABNORMAL HIGH (ref 6–20)
BUN: 54 mg/dL — AB (ref 6–20)
CALCIUM: 7.5 mg/dL — AB (ref 8.9–10.3)
CHLORIDE: 127 mmol/L — AB (ref 101–111)
CHLORIDE: 125 mmol/L — AB (ref 101–111)
CO2: 22 mmol/L (ref 22–32)
CO2: 21 mmol/L — AB (ref 22–32)
CO2: 23 mmol/L (ref 22–32)
CREATININE: 2.29 mg/dL — AB (ref 0.61–1.24)
Calcium: 8.3 mg/dL — ABNORMAL LOW (ref 8.9–10.3)
Calcium: 7.7 mg/dL — ABNORMAL LOW (ref 8.9–10.3)
Chloride: 130 mmol/L — ABNORMAL HIGH (ref 101–111)
Creatinine, Ser: 2.85 mg/dL — ABNORMAL HIGH (ref 0.61–1.24)
Creatinine, Ser: 2.61 mg/dL — ABNORMAL HIGH (ref 0.61–1.24)
GFR calc Af Amer: 26 mL/min — ABNORMAL LOW (ref >60)
GFR calc Af Amer: 28 mL/min — ABNORMAL LOW (ref >60)
GFR calc Af Amer: 33 mL/min — ABNORMAL LOW (ref >60)
GFR calc non Af Amer: 29 mL/min — ABNORMAL LOW (ref >60)
GFR, EST NON AFRICAN AMERICAN: 22 mL/min — AB (ref >60)
GFR, EST NON AFRICAN AMERICAN: 24 mL/min — AB (ref >60)
GLUCOSE: 132 mg/dL — AB (ref 65–99)
GLUCOSE: 207 mg/dL — AB (ref 65–99)
Glucose, Bld: 165 mg/dL — ABNORMAL HIGH (ref 65–99)
POTASSIUM: 3.3 mmol/L — AB (ref 3.5–5.1)
POTASSIUM: 3.6 mmol/L (ref 3.5–5.1)
Potassium: 3.7 mmol/L (ref 3.5–5.1)
SODIUM: 158 mmol/L — AB (ref 135–145)
Sodium: 164 mmol/L (ref 135–145)
Sodium: 160 mmol/L — ABNORMAL HIGH (ref 135–145)

## 2015-11-12 LAB — CBC
HCT: 52.4 % — ABNORMAL HIGH (ref 39.0–52.0)
HEMOGLOBIN: 16.2 g/dL (ref 13.0–17.0)
MCH: 31.8 pg (ref 26.0–34.0)
MCHC: 30.9 g/dL (ref 30.0–36.0)
MCV: 102.7 fL — ABNORMAL HIGH (ref 78.0–100.0)
Platelets: 112 10*3/uL — ABNORMAL LOW (ref 150–400)
RBC: 5.1 MIL/uL (ref 4.22–5.81)
RDW: 13.7 % (ref 11.5–15.5)
WBC: 14.9 10*3/uL — ABNORMAL HIGH (ref 4.0–10.5)

## 2015-11-12 LAB — HEPATIC FUNCTION PANEL
ALBUMIN: 3.5 g/dL (ref 3.5–5.0)
ALT: 83 U/L — AB (ref 17–63)
AST: 96 U/L — AB (ref 15–41)
Alkaline Phosphatase: 93 U/L (ref 38–126)
BILIRUBIN DIRECT: 0.3 mg/dL (ref 0.1–0.5)
Indirect Bilirubin: 0.6 mg/dL (ref 0.3–0.9)
TOTAL PROTEIN: 6.7 g/dL (ref 6.5–8.1)
Total Bilirubin: 0.9 mg/dL (ref 0.3–1.2)

## 2015-11-12 LAB — AMMONIA: AMMONIA: 21 umol/L (ref 9–35)

## 2015-11-12 LAB — CK: CK TOTAL: 3026 U/L — AB (ref 49–397)

## 2015-11-12 LAB — HEPATITIS C ANTIBODY: HCV Ab: 0.1 s/co ratio (ref 0.0–0.9)

## 2015-11-12 MED ORDER — SODIUM CHLORIDE 0.9 % IV BOLUS (SEPSIS)
500.0000 mL | Freq: Once | INTRAVENOUS | Status: AC
  Administered 2015-11-12: 500 mL via INTRAVENOUS

## 2015-11-12 MED ORDER — POTASSIUM CHLORIDE 10 MEQ/100ML IV SOLN
10.0000 meq | INTRAVENOUS | Status: AC
  Administered 2015-11-12 (×4): 10 meq via INTRAVENOUS
  Filled 2015-11-12 (×3): qty 100

## 2015-11-12 MED ORDER — LORAZEPAM 2 MG/ML IJ SOLN
0.5000 mg | Freq: Two times a day (BID) | INTRAMUSCULAR | Status: DC | PRN
Start: 2015-11-12 — End: 2015-11-16
  Administered 2015-11-12 – 2015-11-14 (×2): 0.5 mg via INTRAVENOUS
  Filled 2015-11-12 (×3): qty 1

## 2015-11-12 MED ORDER — SODIUM CHLORIDE 0.9 % IV BOLUS (SEPSIS)
2000.0000 mL | Freq: Once | INTRAVENOUS | Status: AC
  Administered 2015-11-12: 2000 mL via INTRAVENOUS

## 2015-11-12 MED ORDER — LORAZEPAM 2 MG/ML IJ SOLN
1.0000 mg | Freq: Once | INTRAMUSCULAR | Status: AC
  Administered 2015-11-12: 1 mg via INTRAVENOUS

## 2015-11-12 MED ORDER — POTASSIUM CHLORIDE 10 MEQ/100ML IV SOLN
INTRAVENOUS | Status: AC
Start: 2015-11-12 — End: 2015-11-12
  Administered 2015-11-12: 10 meq via INTRAVENOUS
  Filled 2015-11-12: qty 100

## 2015-11-12 MED ORDER — INSULIN GLARGINE 100 UNIT/ML ~~LOC~~ SOLN
15.0000 [IU] | Freq: Every day | SUBCUTANEOUS | Status: DC
  Administered 2015-11-12: 15 [IU] via SUBCUTANEOUS
  Filled 2015-11-12: qty 0.15

## 2015-11-12 MED ORDER — INSULIN ASPART 100 UNIT/ML ~~LOC~~ SOLN: 0.0000 [IU] | Freq: Every day | SUBCUTANEOUS | Status: DC

## 2015-11-12 MED ORDER — INSULIN ASPART 100 UNIT/ML ~~LOC~~ SOLN
0.0000 [IU] | Freq: Three times a day (TID) | SUBCUTANEOUS | Status: DC
  Administered 2015-11-13: 7 [IU] via SUBCUTANEOUS

## 2015-11-12 MED ORDER — EMTRICITABINE 200 MG PO CAPS
200.0000 mg | ORAL_CAPSULE | ORAL | Status: DC
  Administered 2015-11-13: 200 mg via ORAL
  Filled 2015-11-12: qty 1

## 2015-11-12 NOTE — Progress Notes (Signed)
Addendum  Discussed at length with patient's spouse this evening. She cannot recollect the patient's infectious disease or psychiatrist's name. Patient apparently does not share details of his health care i.e. psychiatric or ID with her. She does not know when he last met with his physicians or took his medications. He did tell her that his psychiatrist's made some medication changes recently. She noted approximately 1.5 weeks history of some confusion which progressively got worse with increasing sleepiness. She does not know if the lithium is new or old. She denies prior history of being told to have DM and was not on medications for same. She indicates that today he looks slightly better compared to yesterday-more alert, asking for something to drink, asking why he is in the hospital and more interactive. Discussed with RN, patient more alert and safe to take orally. We'll transition to Lantus and SSI and discontinue insulin drip 2 hours after Lantus given. Psychiatry has been consulted and will see patient tomorrow. Nephrology input pending. Labs reviewed-sodium has improved to 158 and creatinine to 2.29.  Vernell Leep, MD, FACP, FHM. [facility] Pager [phone]  If 7PM-7AM, please contact night-coverage [email or web address] Password [facility] 11/12/2015, 6:57 PM

## 2015-11-12 NOTE — Progress Notes (Signed)
This RN has been in contact throughout the night with NP on call about pt's condition as well as results from scheduled blood work. Also pt had episode of 6 seconds with SVT in the 180s. Also at this time BP dropped. NP on call notified and orders received and implemented. Will continue to monitor.

## 2015-11-12 NOTE — Consult Note (Addendum)
Renal Service Consult Note [facility]  Samer Dutton 11/12/2015 Delano Metz D Requesting Physician:  Dr Waymon Amato  Reason for Consult: Hypernatremia HPI: The patient is a 65 y.o. year-old with hx of HTN, HIV, depression presented on 3/20 with AMS, high CBG. No hx of DM.  Recent medication change. Getting up at night to urinate frequently lately.  Depression worsening.  Drowsy in ED.  Not eating.  In ED HR 120, BP 114/94, temp 99.1,  WBC 13k, Na 146  K 5.6  CO2 19  Creat 2.5.  Glu 1179, +ketones in UA.  AG 23.  Admitted and started on IV insulin drip. BS has come down to 150 range but creat no better and Na+ worse, up to 167 yesterday and down to 158 today. UOP here has been low, 275 recorded yesterday, has condom cath on.    Patient obtunded.  Wife here , says he never tells her about his medical problems and she doesn't know how long he has been on his HIV meds or the lithium.  They are married with two grown children who don't know he has HIV, although the daughter found out yesterday and the son is on his way here in the next day or two.  Patient doesn't share any medical info with his wife. Patient can't provide any hx right now.  She says he has been vomiting for "about a month", no CP , abd pain or diarrhea.  No hx DM until now.  NO hx renal failure she is aware of. Last creat Nov '16 was 1.0.    Home meds > buspar, cymbalta, isentress, synthroid, Lithium carb 300 tid, ativan prn, Remeron, Ruvada 200-300 (emtricitabine-tenofovir), risperidone, restoril prn     Past Medical History  Past Medical History  Diagnosis Date  . HIV infection (HCC)   . Hypertension   . Depression   . AKI (acute kidney injury) (HCC) 11/10/2015   Past Surgical History History reviewed. No pertinent past surgical history. Family History No family history on file. Social History  reports that he has never smoked. He does not have any smokeless tobacco history on file. His alcohol and drug  histories are not on file. Allergies  Allergies  Allergen Reactions  . Cefaclor     REACTION: rash   Home medications Prior to Admission medications   Medication Sig Start Date End Date Taking? Authorizing Provider  busPIRone (BUSPAR) 15 MG tablet Take 15 mg by mouth 2 (two) times daily. 09/09/15  Yes Historical Provider, MD  DULoxetine (CYMBALTA) 60 MG capsule Take 1 capsule (60 mg total) by mouth 2 (two) times daily. Prescribed by psychiatrist Patient taking differently: Take 60 mg by mouth daily. Prescribed by psychiatrist, confirmed with [facility], 12/10/14 11/22/14  Yes Randall Hiss, MD  ISENTRESS 400 MG tablet TAKE 1 TABLET BY MOUTH TWICE A DAY 01/21/15  Yes Randall Hiss, MD  levothyroxine (SYNTHROID, LEVOTHROID) 50 MCG tablet Take 50 mcg by mouth daily. 10/10/15  Yes Historical Provider, MD  lithium carbonate 300 MG capsule Take 1 capsule (300 mg total) by mouth 3 (three) times daily with meals. Patient taking differently: Take 900 mg by mouth at bedtime.  11/22/14  Yes Randall Hiss, MD  LORazepam (ATIVAN) 0.5 MG tablet Take 1 tablet (0.5 mg total) by mouth every 6 (six) hours as needed for anxiety. Patient taking differently: Take 0.5 mg by mouth 3 (three) times daily.  07/08/14  Yes Randall Hiss, MD  mirtazapine (REMERON) 15 MG tablet Take 15 mg by mouth at bedtime. 09/24/15  Yes Historical Provider, MD  TRUVADA 200-300 MG per tablet TAKE 1 TABLET BY MOUTH DAILY 01/21/15  Yes Randall Hiss, MD  risperiDONE (RISPERDAL) 1 MG tablet Take 1 mg by mouth 2 (two) times daily.    Historical Provider, MD  temazepam (RESTORIL) 15 MG capsule Take 15 mg by mouth at bedtime as needed. Prescribed by psychiatrist 12/06/11   Historical Provider, MD   Liver Function Tests  Recent Labs Lab 11/10/15 1104 11/12/15 0227  AST 60* 96*  ALT 111* 83*  ALKPHOS 140* 93  BILITOT 1.8* 0.9  PROT 8.7* 6.7  ALBUMIN 4.7 3.5    Recent Labs Lab 11/10/15 1104  LIPASE 62*    CBC  Recent Labs Lab 11/10/15 1104 11/10/15 1139 11/11/15 0307 11/12/15 0227  WBC 13.7*  --  21.3* 14.9*  NEUTROABS 12.1*  --   --   --   HGB 20.0* 20.4* 18.9* 16.2  HCT 59.9* 60.0* 57.2* 52.4*  MCV 98.8  --  99.1 102.7*  PLT 171  --  168 112*   Basic Metabolic Panel  Recent Labs Lab 11/11/15 0959 11/11/15 1400 11/11/15 1744 11/11/15 2128 11/12/15 0227 11/12/15 0707 11/12/15 1718  NA 158* 161* 165* 167* 164* 160* 158*  K 5.3* 3.8 3.9 4.0 3.3* 3.6 3.7  CL 121* 129* 130* >130* 130* 127* 125*  CO2 19* 20* 21* 23  GLUCOSE 608* 536* 248* 175* 132* 207* 165*  BUN 63* 68* 66* 66* 64* 62* 54*  CREATININE 3.04* 2.88* 2.89* 2.79* 2.85* 2.61* 2.29*  CALCIUM 8.6* 8.3* 8.6* 8.3* 8.3* 7.7* 7.5*    Filed Vitals:   11/12/15 1200 11/12/15 1400 11/12/15 1600 11/12/15 1800  BP: 114/67 99/56 109/65 129/64  Pulse: 98 82 108 102  Temp: 98.9 F (37.2 C)  99.6 F (37.6 C)   TempSrc: Axillary  Axillary   Resp: 31 36 35 25  Height:      Weight:      SpO2: 95% 96% 96% 97%   Exam Confused, not responding to questions, writhes around in bed, moves all ext, follows simple commands, no verbal responses No rash, cyanosis or gangrene Sclera anicteric, throat dry  No jvd or bruits Chest clear bilat RRR no MRG Abd soft ntnd no mass, liver enlarged liver edge down 6 cm, possible ascites on exam GU normal male MS no joint effusions or deformity Ext no edema / wounds/ ulcers Neuro is stuporous, nonfocal  Abd [place] > liver diffuse ^'d echo, hepatic steatosis vs hepatocellular disease. Kidneys echogenic, no hydro, no mass, 12-14 cm.  UA > glu >1000, mod ketones, yellow, hyaline casts, 30 prot, 1.037, pH 5.0, 0-5 WBC, 0-5 rbc Baseline creat 1.0- 1.2 (2007 > Nov 2016) Admit creat - 2.50 Today's creat - 2.29 Last creat - Jul 11, 2015 = 1.02 UNa / Uosm - not done yet CXR - cleat bilat  Home meds > buspar, cymbalta, isentress, synthroid, Lithium carb 300 tid, ativan prn, Remeron,  Ruvada 200-300 (emtricitabine-tenofovir), risperidone, restoril prn   Assessment: 1 Renal failure, acute vs subacute - last normal creat Nov 16.  Creat 2.2 here.  On tenofovir which is well-known tubular toxin (prox tubule).  Also on lithium which is also a well known renal toxin (all tubular sites) and cause of NDI.  Hopefully AKI but can't be sure not CKD, both Li and tenofovir can cause perm renal damage.  2  Hypernatremia - from new DM and /or NDI from lithium 3 HIV unclear how long this dx 4 AMS - from [facility]^NA, other 5 Depression 6 Vol depletion - is dry on exam   Plan - NS bolus for vol depletion, 2 liters over 6 hours.  Increase D5W for water depletion to 185 cc/hr to correct Na down to 148 in 20 hours.  Check urine osm and Na.  Will follow. DC'd tenofovir.   Vinson Moselleob Rashawna Scoles MD [facility] pager [phone]    cell [phone] 11/12/2015, 6:29 PM

## 2015-11-12 NOTE — Progress Notes (Signed)
PROGRESS NOTE    Micheal Morales  ZOX:[id number]  DOB: 06-30-1951  DOA: 11/10/2015 PCP: Forrest MoronUEHLE, STEPHEN, MD Outpatient Specialists:   Hospital course: 65 year old male with history of HIV on HAART, HTN, depression, hypothyroid, presented to ED on 11/10/15 with history of depression over past couple of weeks PTA and could not see his psychiatrist and some question regarding compliance with medications. Patient was drowsy and unable to provide history which was obtained from his spouse. He only ate sweets, drank sweet juices and kept staring at the TV. Workup in the ED showed glucose of 1179, sodium 146, potassium 5.6, bicarbonate 19 and creatinine 2.5. Anion gap 23. He was admitted to stepdown unit for management of HONK/DKA. Since then, patient has persisting hypernatremia, hyperchloremia, acute kidney injury and altered mental status. CT head negative. Nephrology consulted for assistance with evaluation and management of acute kidney injury and electrolyte abnormalities.   Assessment & Plan:   Acute encephalopathy - May be multifactorial related to electrolyte abnormalities (hyperglycemia, hypernatremia), acute kidney injury complicating underlying depression. - CT head without acute findings. Ammonia: Normal. UDS negative. Urine microscopy-not indicative of UTI. Chest x-ray without acute findings. TSH normal. - Due to intermittent somnolence, temporarily discontinued all psychiatric medications except when necessary Ativan and Haldol. - Treat underlying cause and monitor closely.  Poorly controlled DM 2 with hyperosmolar state and DKA - A1c 06/2015:6.5. No diabetic medications listed on home meds PTA. Glucose on admission >700 and AG 23. - Admitted to stepdown unit and treated per DKA protocol with aggressive IV fluids and insulin drip. DKA had improved but anion gap metabolic acidosis recurred on 8/113/21 and IV insulin drip and IV fluids were reinitiated. - Hemoglobin A1c 3/20:11.7. -  Patient has poor oral intake secondary to altered mental status. For now continue insulin drip with close CBG monitoring.  Hypernatremia, hyperchloremia - Unclear etiology. Only came PTA but unsure as to when he last took his medications. Difficulty monitoring strict intake and output-patient keeps pulling out condom catheter. As per nursing, no large volume urine output and hence suspect no polyuria. Patient does not complain of frequent thirst either. - He might of had hyperchloremic metabolic acidosis yesterday as a cause of his elevated anion gap. - Transitioned to D5 W and sodium has gradually improved from peak of 167 last night to 160 this morning. - Requested nephrology consultation to assist with management of acute kidney injury and hypernatremia/hyperchloremia.  Acute kidney injury - Possibly related to volume depletion. Renal ultrasound without hydronephrosis. Some degree of rhabdomyolysis may be contribute in. - Continue dextrose infusion. Await nephrology input.  Rhabdomyolysis - CK 4496 on 3/21. No history of fall or trauma noted. Follow CK. Continue IV fluids.  HIV - Continue HAART - 07/01/15: CD4: 542 and HIV RNA nondetectable - HIV RNA quantitative <20 on 11/10/15  Hypothyroid - Continue IV Synthroid.  Depression/anxiety - Due to somnolence, temporarily hold BuSpar, Cymbalta, mirtazapine and use when necessary IV lorazepam for anxiety and Haldol for agitation. - Psychiatric consulted. - Lithium level on 3/20:0.17.  Mildly abnormal LFTs - Likely from rhabdomyolysis. Improving. - HCV antibody: 0.1.  Hypokalemia - Replaced  Thrombocytopenia - Unclear etiology. Follow CBC in a.m. No bleeding reported.  Diffuse hepatic steatosis/hepatocellular disease & 1.6 cm indeterminate hypoechoic lesion in right hepatic lobe, seen on ultrasound - Need further workup by MRI in the future.   DVT prophylaxis: SCDs Code Status: Full Family Communication: None at  bedside Disposition Plan: Remains in stepdown unit. Not medically stable  for discharge.   Consultants:  Nephrology-pending  Psychiatry-pending  Procedures:  None  Antimicrobials:  None   Subjective: Patient is somnolent, easily arousable, unable to provide any history. As per nursing, patient somnolent, arousable, fidgety but not agitated. From chart review, received a dose of Haldol last night and Ativan this morning. Keeps pulling a condom catheter.  Objective: Filed Vitals:   11/12/15 0400 11/12/15 0529 11/12/15 0800 11/12/15 1200  BP: 86/58 111/50 115/54   Pulse: 108 101 103   Temp:   98.2 F (36.8 C) 98.9 F (37.2 C)  TempSrc:   Oral Axillary  Resp: 39 25 31   Height:      Weight:      SpO2: 94% 96% 96%     Intake/Output Summary (Last 24 hours) at 11/12/15 1334 Last data filed at 11/12/15 1000  Gross per 24 hour  Intake 3755.74 ml  Output    150 ml  Net 3605.74 ml   Filed Weights   11/10/15 1236 11/10/15 1722 11/11/15 0500  Weight: 79.379 kg (175 lb) 75.2 kg (165 lb 12.6 oz) 74.4 kg (164 lb 0.4 oz)    Exam:  General exam: Moderately built and nourished pleasant middle-aged male lying comfortably supine in bed. Respiratory system: Clear. No increased work of breathing. Cardiovascular system: S1 & S2 heard, regular and mildly tachycardic. No JVD, murmurs, gallops, clicks or pedal edema. Telemetry: SR in the 90s-ST in the 110s. Gastrointestinal system: Abdomen is nondistended, soft and nontender. Normal bowel sounds heard. Central nervous system: Somnolent this morning but easily arousable and oriented to self only. Follow some instructions.. No focal neurological deficits. No neck stiffness. Extremities: Symmetric 5 x 5 power-moving all limbs actively..   Data Reviewed: Basic Metabolic Panel:  Recent Labs Lab 11/11/15 1400 11/11/15 1744 11/11/15 2128 11/12/15 0227 11/12/15 0707  NA 161* 165* 167* 164* 160*  K 3.8 3.9 4.0 3.3* 3.6  CL 129* 130*  >130* 130* 127*  CO2 20* 21*  GLUCOSE 536* 248* 175* 132* 207*  BUN 68* 66* 66* 64* 62*  CREATININE 2.88* 2.89* 2.79* 2.85* 2.61*  CALCIUM 8.3* 8.6* 8.3* 8.3* 7.7*   Liver Function Tests:  Recent Labs Lab 11/10/15 1104 11/12/15 0227  AST 60* 96*  ALT 111* 83*  ALKPHOS 140* 93  BILITOT 1.8* 0.9  PROT 8.7* 6.7  ALBUMIN 4.7 3.5    Recent Labs Lab 11/10/15 1104  LIPASE 62*    Recent Labs Lab 11/12/15 0225  AMMONIA 21   CBC:  Recent Labs Lab 11/10/15 1104 11/10/15 1139 11/11/15 0307 11/12/15 0227  WBC 13.7*  --  21.3* 14.9*  NEUTROABS 12.1*  --   --   --   HGB 20.0* 20.4* 18.9* 16.2  HCT 59.9* 60.0* 57.2* 52.4*  MCV 98.8  --  99.1 102.7*  PLT 171  --  168 112*   Cardiac Enzymes:  Recent Labs Lab 11/11/15 1400  CKTOTAL 4496*   BNP (last 3 results) No results for input(s): PROBNP in the last 8760 hours. CBG:  Recent Labs Lab 11/12/15 0627 11/12/15 0716 11/12/15 0818 11/12/15 0925 11/12/15 1136  GLUCAP 152* 169* 197* 218* 186*    Recent Results (from the past 240 hour(s))  MRSA PCR Screening     Status: None   Collection Time: 11/10/15  5:25 PM  Result Value Ref Range Status   MRSA by PCR NEGATIVE NEGATIVE Final    Comment:        The GeneXpert MRSA Assay (  FDA approved for NASAL specimens only), is one component of a comprehensive MRSA colonization surveillance program. It is not intended to diagnose MRSA infection nor to guide or monitor treatment for MRSA infections.          Studies: Ct Head Wo Contrast  11/12/2015  CLINICAL DATA:  65 year old male with a history of HIV. Confusion and somnolence EXAM: CT HEAD WITHOUT CONTRAST TECHNIQUE: Contiguous axial images were obtained from the base of the skull through the vertex without intravenous contrast. COMPARISON:  None. FINDINGS: Unremarkable appearance of the calvarium without acute fracture or aggressive lesion. Unremarkable appearance of the scalp soft tissues.  Unremarkable appearance of the bilateral orbits. Mastoid air cells are clear. No significant paranasal sinus disease No acute intracranial hemorrhage, midline shift, or mass effect. Gray-white differentiation is maintained, without CT evidence of acute ischemia. Unremarkable configuration of the ventricles. IMPRESSION: No CT evidence of acute intracranial abnormality. Signed, Yvone Neu. Loreta Ave, DO [facility] Electronically Signed   By: Gilmer Mor D.O.   On: 11/12/2015 12:45   US Abdomen Complete  11/11/2015  CLINICAL DATA:  Abdominal pain.  Acute kidney injury.  HIV. EXAM: ABDOMEN ULTRASOUND COMPLETE COMPARISON:  None. FINDINGS: Gallbladder: No gallstones or wall thickening visualized. No sonographic Murphy sign noted by sonographer. Common bile duct: Diameter: 3 mm, within normal limits. Liver: Diffusely increased echogenicity of the hepatic parenchyma, consistent with hepatic steatosis/diffuse hepatocellular disease. A focal hypoechoic lesion is seen in the right hepatic lobe adjacent gallbladder fossa measuring approximately 1.6 cm. This could represent focal fatty sparing although other hepatic mass cannot be excluded. IVC: No abnormality visualized. Pancreas: Not well visualized due to overlying bowel gas. Spleen: Size and appearance within normal limits. Right Kidney: Length: 12.3 cm. Echogenicity within normal limits. 2 cm cyst noted in upper pole of right kidney. No mass or hydronephrosis visualized. Left Kidney: Length: 13.4 cm. Echogenicity within normal limits. No mass or hydronephrosis visualized. Abdominal aorta: No aneurysm visualized. Other findings: None. IMPRESSION: No evidence of gallstones or biliary ductal dilatation. Diffuse hepatic steatosis/ hepatocellular disease. 1.6 cm indeterminate hypoechoic lesion in the right hepatic lobe adjacent gallbladder fossa. This could represent focal fatty sparing, although hepatic neoplasm cannot  be excluded. Consider abdomen MRI for further characterization. No evidence of hydronephrosis. Electronically Signed   By: Myles Rosenthal M.D.   On: 11/11/2015 14:06        Scheduled Meds: . emtricitabine-tenofovir AF  1 tablet Oral Daily  . levothyroxine  25 mcg Intravenous Daily  . raltegravir  400 mg Oral BID  . sodium chloride flush  3 mL Intravenous Q12H   Continuous Infusions: . sodium chloride Stopped (11/11/15 1826)  . dextrose 125 mL/hr at 11/12/15 1042  . insulin (NOVOLIN-R) infusion 7.8 Units/hr (11/12/15 1256)    Principal Problem:   Acute encephalopathy Active Problems:   HIV DISEASE   History of hepatitis B   Uncontrolled type 2 DM with hyperosmolar nonketotic hyperglycemia (HCC)   High anion gap metabolic acidosis   Depression   AKI (acute kidney injury) (HCC)   Hyperosmolar (nonketotic) coma (HCC)   Protein-calorie malnutrition, severe    Time spent: 50 minutes.    Marcellus Scott, MD, FACP, FHM. [facility] Pager [phone]  If 7PM-7AM, please contact night-coverage [email or web address] Password [email or web address] 11/12/2015, 1:34 PM    LOS: 2 days

## 2015-11-13 DIAGNOSIS — F329 Major depressive disorder, single episode, unspecified: Secondary | ICD-10-CM

## 2015-11-13 DIAGNOSIS — E870 Hyperosmolality and hypernatremia: Secondary | ICD-10-CM

## 2015-11-13 DIAGNOSIS — F3130 Bipolar disorder, current episode depressed, mild or moderate severity, unspecified: Secondary | ICD-10-CM | POA: Diagnosis present

## 2015-11-13 DIAGNOSIS — N179 Acute kidney failure, unspecified: Secondary | ICD-10-CM

## 2015-11-13 DIAGNOSIS — D696 Thrombocytopenia, unspecified: Secondary | ICD-10-CM

## 2015-11-13 DIAGNOSIS — R410 Disorientation, unspecified: Secondary | ICD-10-CM

## 2015-11-13 DIAGNOSIS — Z21 Asymptomatic human immunodeficiency virus [HIV] infection status: Secondary | ICD-10-CM

## 2015-11-13 DIAGNOSIS — E1310 Other specified diabetes mellitus with ketoacidosis without coma: Principal | ICD-10-CM

## 2015-11-13 DIAGNOSIS — E039 Hypothyroidism, unspecified: Secondary | ICD-10-CM

## 2015-11-13 LAB — NA AND K (SODIUM & POTASSIUM), RAND UR
POTASSIUM UR: 32 mmol/L
Sodium, Ur: 76 mmol/L

## 2015-11-13 LAB — BASIC METABOLIC PANEL
ANION GAP: 8 (ref 5–15)
Anion gap: 9 (ref 5–15)
BUN: 39 mg/dL — ABNORMAL HIGH (ref 6–20)
BUN: 27 mg/dL — ABNORMAL HIGH (ref 6–20)
CHLORIDE: 122 mmol/L — AB (ref 101–111)
CHLORIDE: 117 mmol/L — AB (ref 101–111)
CO2: 18 mmol/L — AB (ref 22–32)
CO2: 20 mmol/L — ABNORMAL LOW (ref 22–32)
CREATININE: 1.58 mg/dL — AB (ref 0.61–1.24)
Calcium: 7.1 mg/dL — ABNORMAL LOW (ref 8.9–10.3)
Calcium: 7.2 mg/dL — ABNORMAL LOW (ref 8.9–10.3)
Creatinine, Ser: 1.8 mg/dL — ABNORMAL HIGH (ref 0.61–1.24)
GFR calc Af Amer: 52 mL/min — ABNORMAL LOW (ref >60)
GFR calc non Af Amer: 38 mL/min — ABNORMAL LOW (ref >60)
GFR, EST AFRICAN AMERICAN: 44 mL/min — AB (ref >60)
GFR, EST NON AFRICAN AMERICAN: 45 mL/min — AB (ref >60)
Glucose, Bld: 280 mg/dL — ABNORMAL HIGH (ref 65–99)
Glucose, Bld: 222 mg/dL — ABNORMAL HIGH (ref 65–99)
POTASSIUM: 3.8 mmol/L (ref 3.5–5.1)
POTASSIUM: 3.4 mmol/L — AB (ref 3.5–5.1)
SODIUM: 149 mmol/L — AB (ref 135–145)
Sodium: 145 mmol/L (ref 135–145)

## 2015-11-13 LAB — GLUCOSE, CAPILLARY
GLUCOSE-CAPILLARY: 267 mg/dL — AB (ref 65–99)
GLUCOSE-CAPILLARY: 222 mg/dL — AB (ref 65–99)
GLUCOSE-CAPILLARY: 215 mg/dL — AB (ref 65–99)
Glucose-Capillary: 200 mg/dL — ABNORMAL HIGH (ref 65–99)
Glucose-Capillary: 323 mg/dL — ABNORMAL HIGH (ref 65–99)

## 2015-11-13 LAB — CBC
HEMATOCRIT: 42.2 % (ref 39.0–52.0)
HEMOGLOBIN: 13.8 g/dL (ref 13.0–17.0)
MCH: 31.9 pg (ref 26.0–34.0)
MCHC: 32.7 g/dL (ref 30.0–36.0)
MCV: 97.7 fL (ref 78.0–100.0)
PLATELETS: 54 10*3/uL — AB (ref 150–400)
RBC: 4.32 MIL/uL (ref 4.22–5.81)
RDW: 12.9 % (ref 11.5–15.5)
WBC: 8.4 10*3/uL (ref 4.0–10.5)

## 2015-11-13 LAB — CHLORIDE, URINE, RANDOM: CHLORIDE URINE: 72 mmol/L

## 2015-11-13 LAB — OSMOLALITY, URINE: Osmolality, Ur: 621 mOsm/kg (ref 300–900)

## 2015-11-13 MED ORDER — SODIUM CHLORIDE 0.9 % IV SOLN
INTRAVENOUS | Status: DC
  Administered 2015-11-13 – 2015-11-14 (×4): via INTRAVENOUS

## 2015-11-13 MED ORDER — INSULIN ASPART 100 UNIT/ML ~~LOC~~ SOLN
0.0000 [IU] | Freq: Every day | SUBCUTANEOUS | Status: DC
  Administered 2015-11-13: 2 [IU] via SUBCUTANEOUS
  Administered 2015-11-15: 3 [IU] via SUBCUTANEOUS

## 2015-11-13 MED ORDER — INSULIN GLARGINE 100 UNIT/ML ~~LOC~~ SOLN
20.0000 [IU] | Freq: Every day | SUBCUTANEOUS | Status: DC
  Administered 2015-11-13 – 2015-11-15 (×3): 20 [IU] via SUBCUTANEOUS
  Filled 2015-11-13 (×4): qty 0.2

## 2015-11-13 MED ORDER — EMTRICITABINE-TENOFOVIR AF 200-25 MG PO TABS
1.0000 | ORAL_TABLET | Freq: Every day | ORAL | Status: DC
  Administered 2015-11-14 – 2015-11-17 (×4): 1 via ORAL
  Filled 2015-11-13 (×4): qty 1

## 2015-11-13 MED ORDER — INSULIN ASPART 100 UNIT/ML ~~LOC~~ SOLN
0.0000 [IU] | Freq: Three times a day (TID) | SUBCUTANEOUS | Status: DC
  Administered 2015-11-13: 8 [IU] via SUBCUTANEOUS
  Administered 2015-11-13: 5 [IU] via SUBCUTANEOUS
  Administered 2015-11-14: 2 [IU] via SUBCUTANEOUS
  Administered 2015-11-14 (×2): 5 [IU] via SUBCUTANEOUS
  Administered 2015-11-15: 3 [IU] via SUBCUTANEOUS
  Administered 2015-11-15: 8 [IU] via SUBCUTANEOUS
  Administered 2015-11-16: 3 [IU] via SUBCUTANEOUS
  Administered 2015-11-16: 5 [IU] via SUBCUTANEOUS
  Administered 2015-11-16: 11 [IU] via SUBCUTANEOUS
  Administered 2015-11-17: 8 [IU] via SUBCUTANEOUS
  Administered 2015-11-17: 3 [IU] via SUBCUTANEOUS

## 2015-11-13 MED ORDER — HALOPERIDOL LACTATE 5 MG/ML IJ SOLN
2.0000 mg | Freq: Two times a day (BID) | INTRAMUSCULAR | Status: DC | PRN
  Administered 2015-11-13: 2 mg via INTRAVENOUS
  Filled 2015-11-13: qty 1

## 2015-11-13 MED ORDER — DOLUTEGRAVIR SODIUM 50 MG PO TABS
50.0000 mg | ORAL_TABLET | Freq: Every day | ORAL | Status: DC
  Administered 2015-11-13 – 2015-11-17 (×5): 50 mg via ORAL
  Filled 2015-11-13 (×5): qty 1

## 2015-11-13 NOTE — Progress Notes (Signed)
[facility] Progress Note   Subjective: more responsive today  Filed Vitals:   11/13/15 0742 11/13/15 0800 11/13/15 1200 11/13/15 1600  BP:  135/61 116/65 134/65  Pulse: 106 106 92 80  Temp:  99.1 F (37.3 C) 99.1 F (37.3 C) 99 F (37.2 C)  TempSrc:  Oral Axillary Axillary  Resp: 33 32 31 18  Height:      Weight:      SpO2: 97% 95% 96% 98%    Inpatient medications: . dolutegravir  50 mg Oral Daily  . [START ON 11/14/2015] emtricitabine-tenofovir AF  1 tablet Oral Daily  . insulin aspart  0-15 Units Subcutaneous TID WC  . insulin aspart  0-5 Units Subcutaneous QHS  . insulin glargine  20 Units Subcutaneous QHS  . levothyroxine  25 mcg Intravenous Daily  . sodium chloride flush  3 mL Intravenous Q12H   . sodium chloride 150 mL/hr at 11/13/15 1621  . dextrose 150 mL/hr at 11/13/15 1621   haloperidol lactate, LORazepam  Exam: Calmer, more eye contact and answers simple questions, disoriented Throat still very dry  No jvd or bruits Chest clear bilat RRR no MRG Abd soft ntnd no mass, liver enlarged liver edge down 6 cm, possible ascites on exam GU normal male MS no joint effusions or deformity Ext no edema / wounds/ ulcers Neuro is stuporous, nonfocal  Abd US > liver diffuse ^'d echo, hepatic steatosis vs hepatocellular disease. Kidneys echogenic, no hydro, no mass, 12-14 cm.  UA > glu >1000, mod ketones, yellow, hyaline casts, 30 prot, 1.037, pH 5.0, 0-5 WBC, 0-5 rbc Last creat - Jul 11, 2015 = 1.02 UNa- 76    Uosm - 621  CXR - cleat bilat  Home meds > buspar, cymbalta, isentress, synthroid, Lithium carb 300 tid, ativan prn, Remeron, Ruvada 200-300 (emtricitabine-tenofovir), risperidone, restoril prn   Assessment: 1 Renal failure, acute vs subacute - creat down 1.8 with lots of fluids.  It's possible that AKI is all volume-related.  Does not have diab insipidus as urine osm is > 600, which is concentrated appropriately in the setting of  hypernatremia.  Will continue NS at 150/ hr (for vol/Na depletion) and 150 cc/hr D5W (for water depletion) 2 Hypernatremia - from osmotic diuresis and relative water depletion 3 HIV unclear how long this dx 4 AMS - improving 5 Depression 6 Vol depletion - is still dry on exam 7 Hepatic steatosis (diffuse)/ hepatocellular disease - can be seen with HIV meds               Plan - as above   Micheal Moselleob [name] MD [facility] pager [phone]    cell [phone] 11/13/2015, 4:32 PM    Recent Labs Lab 11/12/15 0707 11/12/15 1718 11/13/15 0302  NA 160* 158* 149*  K 3.6 3.7 3.8  CL 127* 125* 122*  CO2 21* 23 18*  GLUCOSE 207* 165* 280*  BUN 62* 54* 39*  CREATININE 2.61* 2.29* 1.80*  CALCIUM 7.7* 7.5* 7.1*    Recent Labs Lab 11/10/15 1104 11/12/15 0227  AST 60* 96*  ALT 111* 83*  ALKPHOS 140* 93  BILITOT 1.8* 0.9  PROT 8.7* 6.7  ALBUMIN 4.7 3.5    Recent Labs Lab 11/10/15 1104  11/11/15 0307 11/12/15 0227 11/13/15 0302  WBC 13.7*  --  21.3* 14.9* 8.4  NEUTROABS 12.1*  --   --   --   --   HGB 20.0*  < > 18.9* 16.2 13.8  HCT 59.9*  < >  57.2* 52.4* 42.2  MCV 98.8  --  99.1 102.7* 97.7  PLT 171  --  168 112* 54*  < > = values in this interval not displayed.

## 2015-11-13 NOTE — Consult Note (Signed)
[facility] Face-to-Face Psychiatry Consult   Reason for Consult:  Medication management and history of bipolar depression and AMS Referring Physician:  Dr. Algis Liming Patient Identification: Micheal Morales MRN:  [id number] Principal Diagnosis: Bipolar I disorder, most recent episode depressed [facility]) Diagnosis:   Patient Active Problem List   Diagnosis Date Noted  . Bipolar I disorder, most recent episode depressed ([place]) [F31.30] 11/13/2015  . Protein-calorie malnutrition, severe [E43] 11/11/2015  . Hyperosmolar (nonketotic) coma ([place]) [E11.01]   . Acute encephalopathy [G93.40] 11/10/2015  . Uncontrolled type 2 DM with hyperosmolar nonketotic hyperglycemia ([place]) [E11.00] 11/10/2015  . High anion gap metabolic acidosis [R44.3] 11/10/2015  . Depression [F32.9] 11/10/2015  . AKI (acute kidney injury) ([place]) [N17.9] 11/10/2015  . HIV DISEASE [B20] 10/08/2006  . History of hepatitis B [Z86.19] 10/08/2006    Total Time spent with patient: 1 hour  Subjective:   Micheal Morales is a 65 y.o. male patient admitted with AMS and hyperglycemia.  HPI:  Micheal Morales is a 65 years old male admitted to [facility] with altered mental status and hyperglycemia/diabetic ketoacidosis. Patient seen for face-to-face psychiatric consultation and evaluation of current mental status and medication management for depression and anxiety. Patient appeared lying on his bed and easily woken up and able to communicate with this provider without significant distress, difficult to comprehend because of slightly slurred speech. Patient daughter who was at bedside unable to contribute part of the psychiatric history. Reportedly patient has been depressed over 4 years since he lost his job in [employer]. Patient has been receiving psychiatric medication management initially from the Dr. Doyle Askew at [facility] psychiatry and for the last 2 years seeing a psychiatric nurse [facility]. It is not clear patient has been compliant with his medication or not and also not clear what medication was introduced lately. Patient daughter reported his medication Remeron and lithium seems to be new during the 2017. Patient is also likes to eat sweets and does not share his medical history with his wife or daughter.    medical history:65 year old male with history of HIV on HAART, HTN, depression, hypothyroid, presented to ED on 11/10/15 with history of depression over past couple of weeks PTA and could not see his psychiatrist and some question regarding compliance with medications. Patient was drowsy and unable to provide history which was obtained from his spouse. He only ate sweets, drank sweet juices and kept staring at the TV. Workup in the ED showed glucose of 1179, sodium 146, potassium 5.6, bicarbonate 19 and creatinine 2.5. Anion gap 23. He was admitted to stepdown unit for management of HONK/DKA. Since then, patient has persisting hypernatremia, hyperchloremia, acute kidney injury and altered mental status. CT head negative. Nephrology consulted for assistance with evaluation and management of acute kidney injury and electrolyte abnormalities.  Past Psychiatric History: Patient has no history of inpatient psychiatric hospitalization.   Risk to Self: Is patient at risk for suicide?: No Risk to Others:   Prior Inpatient Therapy:   Prior Outpatient Therapy:    Past Medical History:  Past Medical History  Diagnosis Date  . HIV infection ([place])   . Hypertension   . Depression   . AKI (acute kidney injury) ([place]) 11/10/2015   History reviewed. No pertinent past surgical history. Family History: No family history on file. Family Psychiatric  History: Significant for depression in his brother and father.  Social History:  History  Alcohol Use: Not on file  History  Drug Use Not on file    Social History   Social History  . Marital Status: Married    Spouse Name:  N/A  . Number of Children: N/A  . Years of Education: N/A   Social History Main Topics  . Smoking status: Never Smoker   . Smokeless tobacco: None  . Alcohol Use: None  . Drug Use: None  . Sexual Activity: Not Asked   Other Topics Concern  . None   Social History Narrative   Additional Social History:    Allergies:   Allergies  Allergen Reactions  . Cefaclor     REACTION: rash    Labs:  Results for orders placed or performed during the hospital encounter of 11/10/15 (from the past 48 hour(s))  Glucose, capillary     Status: Abnormal   Collection Time: 11/11/15 11:08 AM  Result Value Ref Range   Glucose-Capillary 521 (H) 65 - 99 mg/dL  Glucose, capillary     Status: Abnormal   Collection Time: 11/11/15 12:13 PM  Result Value Ref Range   Glucose-Capillary >600 (HH) 65 - 99 mg/dL  Glucose, capillary     Status: Abnormal   Collection Time: 11/11/15  1:32 PM  Result Value Ref Range   Glucose-Capillary 587 (HH) 65 - 99 mg/dL  Basic metabolic panel     Status: Abnormal   Collection Time: 11/11/15  2:00 PM  Result Value Ref Range   Sodium 161 (HH) 135 - 145 mmol/L    Comment: CRITICAL RESULT CALLED TO, READ BACK BY AND VERIFIED WITH: S.MAYES RN [id number] A.QUIZON    Potassium 3.8 3.5 - 5.1 mmol/L    Comment: REPEATED TO VERIFY DELTA CHECK NOTED    Chloride 129 (H) 101 - 111 mmol/L   CO2 20 (L) 22 - 32 mmol/L   Glucose, Bld 536 (H) 65 - 99 mg/dL   BUN 68 (H) 6 - 20 mg/dL   Creatinine, Ser 4.11 (H) 0.61 - 1.24 mg/dL   Calcium 8.3 (L) 8.9 - 10.3 mg/dL   GFR calc non Af Amer 22 (L) >60 mL/min   GFR calc Af Amer 25 (L) >60 mL/min    Comment: (NOTE) The eGFR has been calculated using the CKD EPI equation. This calculation has not been validated in all clinical situations. eGFR's persistently <60 mL/min signify possible Chronic Kidney Disease.    Anion gap 12 5 - 15  Hepatitis C antibody     Status: None   Collection Time: 11/11/15  2:00 PM  Result Value Ref  Range   HCV Ab 0.1 0.0 - 0.9 s/co ratio    Comment: (NOTE)                                  Negative:     < 0.8                             Indeterminate: 0.8 - 0.9                                  Positive:     > 0.9 The CDC recommends that a positive HCV antibody result be followed up with a HCV Nucleic Acid Amplification test (464314). Performed At: [facility] [address] [place], [place] [id number] Mila Homer  MD ZO:[id number]   CK     Status: Abnormal   Collection Time: 11/11/15  2:00 PM  Result Value Ref Range   Total CK 4496 (H) 49 - 397 U/L    Comment: RESULTS CONFIRMED BY MANUAL DILUTION  Glucose, capillary     Status: Abnormal   Collection Time: 11/11/15  2:37 PM  Result Value Ref Range   Glucose-Capillary 465 (H) 65 - 99 mg/dL  Glucose, capillary     Status: Abnormal   Collection Time: 11/11/15  3:42 PM  Result Value Ref Range   Glucose-Capillary 391 (H) 65 - 99 mg/dL  Glucose, capillary     Status: Abnormal   Collection Time: 11/11/15  5:15 PM  Result Value Ref Range   Glucose-Capillary 266 (H) 65 - 99 mg/dL  Basic metabolic panel     Status: Abnormal   Collection Time: 11/11/15  5:44 PM  Result Value Ref Range   Sodium 165 (HH) 135 - 145 mmol/L    Comment: CRITICAL RESULT CALLED TO, READ BACK BY AND VERIFIED WITH: DILLON,S. RN '@1821'$  ON 3.21.17 BY MCCOY,N.    Potassium 3.9 3.5 - 5.1 mmol/L   Chloride 130 (H) 101 - 111 mmol/L   CO2 23 22 - 32 mmol/L   Glucose, Bld 248 (H) 65 - 99 mg/dL   BUN 66 (H) 6 - 20 mg/dL   Creatinine, Ser 2.89 (H) 0.61 - 1.24 mg/dL   Calcium 8.6 (L) 8.9 - 10.3 mg/dL   GFR calc non Af Amer 22 (L) >60 mL/min   GFR calc Af Amer 25 (L) >60 mL/min    Comment: (NOTE) The eGFR has been calculated using the CKD EPI equation. This calculation has not been validated in all clinical situations. eGFR's persistently <60 mL/min signify possible Chronic Kidney Disease.    Anion gap 12 5 - 15  Glucose, capillary     Status:  Abnormal   Collection Time: 11/11/15  6:22 PM  Result Value Ref Range   Glucose-Capillary 212 (H) 65 - 99 mg/dL  Glucose, capillary     Status: Abnormal   Collection Time: 11/11/15  7:22 PM  Result Value Ref Range   Glucose-Capillary 166 (H) 65 - 99 mg/dL  Glucose, capillary     Status: Abnormal   Collection Time: 11/11/15  8:29 PM  Result Value Ref Range   Glucose-Capillary 142 (H) 65 - 99 mg/dL  Glucose, capillary     Status: Abnormal   Collection Time: 11/11/15  9:25 PM  Result Value Ref Range   Glucose-Capillary 154 (H) 65 - 99 mg/dL  Basic metabolic panel     Status: Abnormal   Collection Time: 11/11/15  9:28 PM  Result Value Ref Range   Sodium 167 (HH) 135 - 145 mmol/L    Comment: CRITICAL RESULT CALLED TO, READ BACK BY AND VERIFIED WITH: K GARCIA RN @ 2218 ON 11/11/15 BY C DAVIS    Potassium 4.0 3.5 - 5.1 mmol/L   Chloride >130 (HH) 101 - 111 mmol/L    Comment: CRITICAL RESULT CALLED TO, READ BACK BY AND VERIFIED WITH: K GARCIA RN @ 2218 ON 11/11/15 BY C DAVIS    CO2 24 22 - 32 mmol/L   Glucose, Bld 175 (H) 65 - 99 mg/dL   BUN 66 (H) 6 - 20 mg/dL   Creatinine, Ser 2.79 (H) 0.61 - 1.24 mg/dL   Calcium 8.3 (L) 8.9 - 10.3 mg/dL   GFR calc non Af Amer 23 (L) >60 mL/min  GFR calc Af Amer 26 (L) >60 mL/min    Comment: (NOTE) The eGFR has been calculated using the CKD EPI equation. This calculation has not been validated in all clinical situations. eGFR's persistently <60 mL/min signify possible Chronic Kidney Disease.    Anion gap NOT CALCULATED 5 - 15  Glucose, capillary     Status: Abnormal   Collection Time: 11/11/15 10:27 PM  Result Value Ref Range   Glucose-Capillary 161 (H) 65 - 99 mg/dL   Comment 1 Notify RN   Glucose, capillary     Status: Abnormal   Collection Time: 11/11/15 11:16 PM  Result Value Ref Range   Glucose-Capillary 174 (H) 65 - 99 mg/dL  Glucose, capillary     Status: Abnormal   Collection Time: 11/12/15 12:21 AM  Result Value Ref Range    Glucose-Capillary 181 (H) 65 - 99 mg/dL  Glucose, capillary     Status: Abnormal   Collection Time: 11/12/15  1:20 AM  Result Value Ref Range   Glucose-Capillary 197 (H) 65 - 99 mg/dL  Glucose, capillary     Status: Abnormal   Collection Time: 11/12/15  2:19 AM  Result Value Ref Range   Glucose-Capillary 136 (H) 65 - 99 mg/dL  Ammonia     Status: None   Collection Time: 11/12/15  2:25 AM  Result Value Ref Range   Ammonia 21 9 - 35 umol/L  CBC     Status: Abnormal   Collection Time: 11/12/15  2:27 AM  Result Value Ref Range   WBC 14.9 (H) 4.0 - 10.5 K/uL   RBC 5.10 4.22 - 5.81 MIL/uL   Hemoglobin 16.2 13.0 - 17.0 g/dL   HCT 52.4 (H) 39.0 - 52.0 %   MCV 102.7 (H) 78.0 - 100.0 fL   MCH 31.8 26.0 - 34.0 pg   MCHC 30.9 30.0 - 36.0 g/dL   RDW 13.7 11.5 - 15.5 %   Platelets 112 (L) 150 - 400 K/uL    Comment: SPECIMEN CHECKED FOR CLOTS PLATELET COUNT CONFIRMED BY SMEAR   Basic metabolic panel     Status: Abnormal   Collection Time: 11/12/15  2:27 AM  Result Value Ref Range   Sodium 164 (HH) 135 - 145 mmol/L    Comment: CRITICAL RESULT CALLED TO, READ BACK BY AND VERIFIED WITH: Orlie Pollen, RN 0320 11/12/15 BY A NORMAN    Potassium 3.3 (L) 3.5 - 5.1 mmol/L    Comment: DELTA CHECK NOTED REPEATED TO VERIFY    Chloride 130 (H) 101 - 111 mmol/L   CO2 22 22 - 32 mmol/L   Glucose, Bld 132 (H) 65 - 99 mg/dL   BUN 64 (H) 6 - 20 mg/dL   Creatinine, Ser 2.85 (H) 0.61 - 1.24 mg/dL   Calcium 8.3 (L) 8.9 - 10.3 mg/dL   GFR calc non Af Amer 22 (L) >60 mL/min   GFR calc Af Amer 26 (L) >60 mL/min    Comment: (NOTE) The eGFR has been calculated using the CKD EPI equation. This calculation has not been validated in all clinical situations. eGFR's persistently <60 mL/min signify possible Chronic Kidney Disease.    Anion gap 12 5 - 15  Hepatic function panel     Status: Abnormal   Collection Time: 11/12/15  2:27 AM  Result Value Ref Range   Total Protein 6.7 6.5 - 8.1 g/dL   Albumin 3.5  3.5 - 5.0 g/dL   AST 96 (H) 15 - 41 U/L   ALT 83 (  H) 17 - 63 U/L   Alkaline Phosphatase 93 38 - 126 U/L   Total Bilirubin 0.9 0.3 - 1.2 mg/dL   Bilirubin, Direct 0.3 0.1 - 0.5 mg/dL   Indirect Bilirubin 0.6 0.3 - 0.9 mg/dL  Glucose, capillary     Status: Abnormal   Collection Time: 11/12/15  3:15 AM  Result Value Ref Range   Glucose-Capillary 137 (H) 65 - 99 mg/dL  Glucose, capillary     Status: Abnormal   Collection Time: 11/12/15  4:26 AM  Result Value Ref Range   Glucose-Capillary 148 (H) 65 - 99 mg/dL  Glucose, capillary     Status: Abnormal   Collection Time: 11/12/15  5:23 AM  Result Value Ref Range   Glucose-Capillary 123 (H) 65 - 99 mg/dL  Glucose, capillary     Status: Abnormal   Collection Time: 11/12/15  6:27 AM  Result Value Ref Range   Glucose-Capillary 152 (H) 65 - 99 mg/dL  Basic metabolic panel     Status: Abnormal   Collection Time: 11/12/15  7:07 AM  Result Value Ref Range   Sodium 160 (H) 135 - 145 mmol/L   Potassium 3.6 3.5 - 5.1 mmol/L   Chloride 127 (H) 101 - 111 mmol/L   CO2 21 (L) 22 - 32 mmol/L   Glucose, Bld 207 (H) 65 - 99 mg/dL   BUN 62 (H) 6 - 20 mg/dL   Creatinine, Ser 2.61 (H) 0.61 - 1.24 mg/dL   Calcium 7.7 (L) 8.9 - 10.3 mg/dL   GFR calc non Af Amer 24 (L) >60 mL/min   GFR calc Af Amer 28 (L) >60 mL/min    Comment: (NOTE) The eGFR has been calculated using the CKD EPI equation. This calculation has not been validated in all clinical situations. eGFR's persistently <60 mL/min signify possible Chronic Kidney Disease.    Anion gap 12 5 - 15  CK     Status: Abnormal   Collection Time: 11/12/15  7:07 AM  Result Value Ref Range   Total CK 3026 (H) 49 - 397 U/L  Glucose, capillary     Status: Abnormal   Collection Time: 11/12/15  7:16 AM  Result Value Ref Range   Glucose-Capillary 169 (H) 65 - 99 mg/dL  Glucose, capillary     Status: Abnormal   Collection Time: 11/12/15  8:18 AM  Result Value Ref Range   Glucose-Capillary 197 (H) 65 - 99  mg/dL  Glucose, capillary     Status: Abnormal   Collection Time: 11/12/15  9:25 AM  Result Value Ref Range   Glucose-Capillary 218 (H) 65 - 99 mg/dL  Glucose, capillary     Status: Abnormal   Collection Time: 11/12/15 10:31 AM  Result Value Ref Range   Glucose-Capillary 199 (H) 65 - 99 mg/dL  Glucose, capillary     Status: Abnormal   Collection Time: 11/12/15 11:36 AM  Result Value Ref Range   Glucose-Capillary 186 (H) 65 - 99 mg/dL   Comment 1 Notify RN    Comment 2 Document in Chart   Glucose, capillary     Status: Abnormal   Collection Time: 11/12/15 12:54 PM  Result Value Ref Range   Glucose-Capillary 156 (H) 65 - 99 mg/dL  Glucose, capillary     Status: Abnormal   Collection Time: 11/12/15  1:57 PM  Result Value Ref Range   Glucose-Capillary 154 (H) 65 - 99 mg/dL  Glucose, capillary     Status: Abnormal   Collection Time: 11/12/15  2:51 PM  Result Value Ref Range   Glucose-Capillary 123 (H) 65 - 99 mg/dL  Glucose, capillary     Status: Abnormal   Collection Time: 11/12/15  3:44 PM  Result Value Ref Range   Glucose-Capillary 159 (H) 65 - 99 mg/dL  Glucose, capillary     Status: Abnormal   Collection Time: 11/12/15  4:45 PM  Result Value Ref Range   Glucose-Capillary 125 (H) 65 - 99 mg/dL  Basic metabolic panel     Status: Abnormal   Collection Time: 11/12/15  5:18 PM  Result Value Ref Range   Sodium 158 (H) 135 - 145 mmol/L   Potassium 3.7 3.5 - 5.1 mmol/L   Chloride 125 (H) 101 - 111 mmol/L   CO2 23 22 - 32 mmol/L   Glucose, Bld 165 (H) 65 - 99 mg/dL   BUN 54 (H) 6 - 20 mg/dL   Creatinine, Ser 2.29 (H) 0.61 - 1.24 mg/dL   Calcium 7.5 (L) 8.9 - 10.3 mg/dL   GFR calc non Af Amer 29 (L) >60 mL/min   GFR calc Af Amer 33 (L) >60 mL/min    Comment: (NOTE) The eGFR has been calculated using the CKD EPI equation. This calculation has not been validated in all clinical situations. eGFR's persistently <60 mL/min signify possible Chronic Kidney Disease.    Anion gap  10 5 - 15  Glucose, capillary     Status: Abnormal   Collection Time: 11/12/15  5:52 PM  Result Value Ref Range   Glucose-Capillary 148 (H) 65 - 99 mg/dL   Comment 1 Notify RN    Comment 2 Document in Chart   Glucose, capillary     Status: Abnormal   Collection Time: 11/12/15  7:01 PM  Result Value Ref Range   Glucose-Capillary 159 (H) 65 - 99 mg/dL  Glucose, capillary     Status: Abnormal   Collection Time: 11/12/15  8:12 PM  Result Value Ref Range   Glucose-Capillary 158 (H) 65 - 99 mg/dL  Osmolality, urine     Status: None   Collection Time: 11/12/15  8:59 PM  Result Value Ref Range   Osmolality, Ur 621 300 - 900 mOsm/kg    Comment: Performed at [facility]  Na and K (sodium & potassium), rand urine     Status: None   Collection Time: 11/12/15  8:59 PM  Result Value Ref Range   Sodium, Ur 76 mmol/L   Potassium Urine Timed 32 mmol/L    Comment: Performed at [facility]  Chloride, urine, random     Status: None   Collection Time: 11/12/15  8:59 PM  Result Value Ref Range   Chloride Urine 72 mmol/L    Comment: Performed at [facility]  Glucose, capillary     Status: Abnormal   Collection Time: 11/12/15  9:11 PM  Result Value Ref Range   Glucose-Capillary 166 (H) 65 - 99 mg/dL  Glucose, capillary     Status: Abnormal   Collection Time: 11/13/15 12:41 AM  Result Value Ref Range   Glucose-Capillary 200 (H) 65 - 99 mg/dL  Basic metabolic panel     Status: Abnormal   Collection Time: 11/13/15  3:02 AM  Result Value Ref Range   Sodium 149 (H) 135 - 145 mmol/L    Comment: DELTA CHECK NOTED REPEATED TO VERIFY    Potassium 3.8 3.5 - 5.1 mmol/L   Chloride 122 (H) 101 - 111 mmol/L   CO2 18 (L)  22 - 32 mmol/L   Glucose, Bld 280 (H) 65 - 99 mg/dL   BUN 39 (H) 6 - 20 mg/dL   Creatinine, Ser 1.80 (H) 0.61 - 1.24 mg/dL   Calcium 7.1 (L) 8.9 - 10.3 mg/dL   GFR calc non Af Amer 38 (L) >60 mL/min   GFR calc Af Amer 44 (L) >60 mL/min    Comment: (NOTE) The  eGFR has been calculated using the CKD EPI equation. This calculation has not been validated in all clinical situations. eGFR's persistently <60 mL/min signify possible Chronic Kidney Disease.    Anion gap 9 5 - 15  CBC     Status: Abnormal   Collection Time: 11/13/15  3:02 AM  Result Value Ref Range   WBC 8.4 4.0 - 10.5 K/uL   RBC 4.32 4.22 - 5.81 MIL/uL   Hemoglobin 13.8 13.0 - 17.0 g/dL   HCT 42.2 39.0 - 52.0 %   MCV 97.7 78.0 - 100.0 fL   MCH 31.9 26.0 - 34.0 pg   MCHC 32.7 30.0 - 36.0 g/dL   RDW 12.9 11.5 - 15.5 %   Platelets 54 (L) 150 - 400 K/uL    Comment: SPECIMEN CHECKED FOR CLOTS REPEATED TO VERIFY DELTA CHECK NOTED PLATELET COUNT CONFIRMED BY SMEAR   Glucose, capillary     Status: Abnormal   Collection Time: 11/13/15  8:42 AM  Result Value Ref Range   Glucose-Capillary 323 (H) 65 - 99 mg/dL   Comment 1 Notify RN    Comment 2 Document in Chart     Current Facility-Administered Medications  Medication Dose Route Frequency Provider Last Rate Last Dose  . 0.9 %  sodium chloride infusion   Intravenous Continuous Roney Jaffe, MD 150 mL/hr at 11/13/15 0745    . dextrose 5 % solution   Intravenous Continuous Roney Jaffe, MD 185 mL/hr at 11/13/15 [phone]    . emtricitabine (EMTRIVA) capsule 200 mg  200 mg Oral Q48H Roney Jaffe, MD   200 mg at 11/13/15 0956  . haloperidol lactate (HALDOL) injection 5 mg  5 mg Intravenous Q6H PRN Orson Eva, MD   5 mg at 11/13/15 0740  . insulin aspart (novoLOG) injection 0-5 Units  0-5 Units Subcutaneous QHS Modena Jansky, MD   0 Units at 11/12/15 2200  . insulin aspart (novoLOG) injection 0-9 Units  0-9 Units Subcutaneous TID WC Modena Jansky, MD   7 Units at 11/13/15 [phone]  . insulin glargine (LANTUS) injection 15 Units  15 Units Subcutaneous QHS Modena Jansky, MD   15 Units at 11/12/15 1953  . levothyroxine (SYNTHROID, LEVOTHROID) injection 25 mcg  25 mcg Intravenous Daily Orson Eva, MD   25 mcg at 11/13/15 0956  . LORazepam  (ATIVAN) injection 0.5 mg  0.5 mg Intravenous Q12H PRN Modena Jansky, MD   0.5 mg at 11/12/15 1001  . ondansetron (ZOFRAN) tablet 4 mg  4 mg Oral Q6H PRN Robbie Lis, MD       Or  . ondansetron [facility]) injection 4 mg  4 mg Intravenous Q6H PRN Robbie Lis, MD      . raltegravir (ISENTRESS) tablet 400 mg  400 mg Oral BID Robbie Lis, MD   400 mg at 11/13/15 0956  . sodium chloride flush (NS) 0.9 % injection 3 mL  3 mL Intravenous Q12H Robbie Lis, MD   3 mL at 11/13/15 0957    Musculoskeletal: Strength & Muscle Tone: decreased Gait & Station: unable  to stand Patient leans: N/A  Psychiatric Specialty Exam: ROS depression, anxiety, confusion, poor concentration No Fever-chills, No Headache, No changes with Vision or hearing, reports vertigo No problems swallowing food or Liquids, No Chest pain, Cough or Shortness of Breath, No Abdominal pain, No Nausea or Vommitting, Bowel movements are regular, No Blood in stool or Urine, No dysuria, No new skin rashes or bruises, No new joints pains-aches,  No new weakness, tingling, numbness in any extremity, No recent weight gain or loss, No polyuria, polydypsia or polyphagia,   A full 10 point Review of Systems was done, except as stated above, all other Review of Systems were negative.  Blood pressure 135/61, pulse 106, temperature 99.1 F (37.3 C), temperature source Oral, resp. rate 32, height '5\' 5"'$  (1.651 m), weight 80.1 kg (176 lb 9.4 oz), SpO2 95 %.Body mass index is 29.39 kg/(m^2).  General Appearance: Bizarre and Guarded  Eye Contact::  Fair  Speech:  Slurred  Volume:  Decreased  Mood:  Anxious and Depressed  Affect:  Constricted and Depressed  Thought Process:  Coherent and Linear  Orientation:  Full (Time, Place, and Person)  Thought Content:  Rumination  Suicidal Thoughts:  No  Homicidal Thoughts:  No  Memory:  Immediate;   Fair Recent;   Poor  Judgement:  Impaired  Insight:  Shallow  Psychomotor Activity:   Decreased  Concentration:  Fair  Recall:  [place] of Knowledge:Good  Language: Good  Akathisia:  Negative  Handed:  Right  AIMS (if indicated):     Assets:  Communication Skills Desire for Improvement Financial Resources/Insurance Housing Intimacy Leisure Time Resilience Social Support Transportation  ADL's:  Impaired  Cognition: Impaired,  Moderate  Sleep:      Treatment Plan Summary: Daily contact with patient to assess and evaluate symptoms and progress in treatment and Medication management   Agrees with hold on psychotropic medication as patient been with altered mental status and has multiple abnormal electrolytes and blood sugar   Psychiatric medication can be restarted with the mild doses when he becomes more medically stable  Refer to the psychiatric social service regarding obtaining medical records/recent medication changes from [facility] and his provider is [employer].   Patient family supportive to him.  Disposition: Patient does not meet criteria for psychiatric inpatient admission. Supportive therapy provided about ongoing stressors.  Durward Parcel., MD 11/13/2015 10:08 AM

## 2015-11-13 NOTE — Progress Notes (Signed)
Inpatient Diabetes Program Recommendations  AACE/ADA: New Consensus Statement on Inpatient Glycemic Control (2015)  Target Ranges:  Prepandial:   less than 140 mg/dL      Peak postprandial:   less than 180 mg/dL (1-2 hours)      Critically ill patients:  140 - 180 mg/dL   Review of Glycemic Control Results for Micheal Morales, Micheal Morales (MRN [id number]) as of 11/13/2015 08:50  Ref. Range 11/12/2015 19:01 11/12/2015 20:12 11/12/2015 21:11 11/13/2015 00:41 11/13/2015 08:42  Glucose-Capillary Latest Ref Range: 65-99 mg/dL 562159 (H) 130158 (H) 865166 (H) 200 (H) 323 (H)   Diabetes history: No hx Current orders for Inpatient glycemic control: Lantus 15 units q hs + Novolog Correction scale Sensitive 0-9 units.  Inpatient Diabetes Program Recommendations:  Please consider increase in Lantus to 25 units daily + increase Novolog correction scale to moderate 0-15. Patient will need diabetes education once appropriate.  Thank you, Billy FischerJudy E. Jaimya Feliciano, RN, MSN, CDE Inpatient Glycemic Control Team Team Pager [phone] (8am-5pm) 11/13/2015 8:57 AM

## 2015-11-13 NOTE — Consult Note (Signed)
[facility]  Date of Admission:  11/10/2015  Date of Consult:  11/13/2015  Reason for Consult: HIV+, HONK. ARF Referring Physician: Hongalgi  Impression/Recommendation HIV+ Continue descovy Change isn to dtgv Comment- descovy is a new formulation of tenofovir alafenomide that is renal safe. His Cr is now well within the range that he should be able to take this.  Would query if his ARF is from his Lithium.  Discussed with his PCP (Dr Tommy Medal)  Newly Dx DM, DKA Hydration, insulin mgmt per primary team.   ARF improving  Hypothyroid  Depression Question that he was previously on Li+. Currently he is delirious.  Would suggest he may have something other than typical depression.   Thank you so much for this interesting consult,   [name] (pager) [phone] [email or web address]  Micheal Morales is an 65 y.o. male.  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 He comes to hospital on 3-20 with worsening depression, confusion. In ED he was found to have Cr of 2.5 and Glc 1179. He had no ketones, but his AG was 23. He was started on insulin drip as well as aggressive hydration. He was found to have an A1C of 11.7.  His hospital course was marked by hyperNatremia (up to 167). He was eval by renal and his tenofovir was stopped. His Cr is 1.8 now, Na 149.   HIV 1 RNA QUANT (copies/mL)  Date Value  11/10/2015 <20  07/03/2013 <20  02/01/2013 <20   HIV-1 RNA VIRAL LOAD (no units)  Date Value  07/01/2015 <40  06/25/2014 71  01/22/2014 <40   CD4 (no units)  Date Value  07/01/2015 542  06/25/2014 542  12/10/2011 521   CD4 T CELL ABS  Date Value  11/10/2015 300 /uL*  07/03/2013 490 /uL  02/01/2013 400 cmm      Past Medical History  Diagnosis Date  . HIV infection ([place])   . Hypertension   . Depression   . AKI (acute kidney injury) ([place]) 11/10/2015    History reviewed. No  pertinent past surgical history.   Allergies  Allergen Reactions  . Cefaclor     REACTION: rash    Medications:  Scheduled: . emtricitabine  200 mg Oral Q48H  . insulin aspart  0-15 Units Subcutaneous TID WC  . insulin aspart  0-5 Units Subcutaneous QHS  . insulin glargine  20 Units Subcutaneous QHS  . levothyroxine  25 mcg Intravenous Daily  . raltegravir  400 mg Oral BID  . sodium chloride flush  3 mL Intravenous Q12H    Abtx:  Anti-infectives    Start     Dose/Rate Route Frequency Ordered Stop   11/13/15 1000  emtricitabine (EMTRIVA) capsule 200 mg     200 mg Oral Every 48 hours 11/12/15 1924     11/10/15 2200  raltegravir (ISENTRESS) tablet 400 mg     400 mg Oral 2 times daily 11/10/15 1818     11/10/15 2000  emtricitabine-tenofovir AF (DESCOVY) 200-25 MG per tablet 1 tablet  Status:  Discontinued     1 tablet Oral Daily 11/10/15 1838 11/12/15 1923   11/10/15 1830  emtricitabine-tenofovir AF (DESCOVY) 200-25 MG per tablet 1 tablet  Status:  Discontinued     1 tablet Oral Daily 11/10/15 1818 11/10/15 1838      Total days of antibiotics: 0  Social History:  reports that he has never smoked. He does not have any smokeless tobacco history on file. His alcohol and drug histories are not on file.  No family history on file.  General ROS: unable to illicit, confused  Blood pressure 135/61, pulse 106, temperature 99.1 F (37.3 C), temperature source Oral, resp. rate 32, height '5\' 5"'$  (1.651 m), weight 80.1 kg (176 lb 9.4 oz), SpO2 95 %. General appearance: alert, delirious and no distress Eyes: negative findings: conjunctivae and sclerae normal and pupils equal, round, reactive to light and accomodation Throat: abnormal findings: dry Neck: no adenopathy and supple, symmetrical, trachea midline Lungs: clear to auscultation bilaterally Heart: regular rate and rhythm Abdomen: normal findings: bowel sounds normal and soft, non-tender Extremities: edema none  Mental  status- place [place], date ?   Results for orders placed or performed during the hospital encounter of 11/10/15 (from the past 48 hour(s))  Basic metabolic panel     Status: Abnormal   Collection Time: 11/11/15  9:59 AM  Result Value Ref Range   Sodium 158 (H) 135 - 145 mmol/L   Potassium 5.3 (H) 3.5 - 5.1 mmol/L    Comment: REPEATED TO VERIFY DELTA CHECK NOTED NO VISIBLE HEMOLYSIS    Chloride 121 (H) 101 - 111 mmol/L   CO2 19 (L) 22 - 32 mmol/L   Glucose, Bld 608 (HH) 65 - 99 mg/dL    Comment: CRITICAL RESULT CALLED TO, READ BACK BY AND VERIFIED WITH: S.MAYES RN [phone] [id number] A.QUIZON    BUN 63 (H) 6 - 20 mg/dL   Creatinine, Ser 3.04 (H) 0.61 - 1.24 mg/dL   Calcium 8.6 (L) 8.9 - 10.3 mg/dL   GFR calc non Af Amer 20 (L) >60 mL/min   GFR calc Af Amer 24 (L) >60 mL/min    Comment: (NOTE) The eGFR has been calculated using the CKD EPI equation. This calculation has not been validated in all clinical situations. eGFR's persistently <60 mL/min signify possible Chronic Kidney Disease.    Anion gap 18 (H) 5 - 15  Glucose, capillary     Status: Abnormal   Collection Time: 11/11/15 11:08 AM  Result Value Ref Range   Glucose-Capillary 521 (H) 65 - 99 mg/dL  Glucose, capillary     Status: Abnormal   Collection Time: 11/11/15 12:13 PM  Result Value Ref Range   Glucose-Capillary >600 (HH) 65 - 99 mg/dL  Glucose, capillary     Status: Abnormal   Collection Time: 11/11/15  1:32 PM  Result Value Ref Range   Glucose-Capillary 587 (HH) 65 - 99 mg/dL  Basic metabolic panel     Status: Abnormal   Collection Time: 11/11/15  2:00 PM  Result Value Ref Range   Sodium 161 (HH) 135 - 145 mmol/L    Comment: CRITICAL RESULT CALLED TO, READ BACK BY AND VERIFIED WITH: S.MAYES RN [phone] [id number] A.QUIZON    Potassium 3.8 3.5 - 5.1 mmol/L    Comment: REPEATED TO VERIFY DELTA CHECK NOTED    Chloride 129 (H) 101 - 111 mmol/L   CO2 20 (L) 22 - 32 mmol/L   Glucose, Bld 536 (H) 65 - 99 mg/dL   BUN 68 (H)  6 - 20 mg/dL   Creatinine, Ser 2.88 (H) 0.61 - 1.24 mg/dL   Calcium 8.3 (L) 8.9 - 10.3 mg/dL   GFR calc non Af Amer 22 (L) >60 mL/min   GFR calc Af Amer 25 (L) >60 mL/min    Comment: (NOTE) The eGFR  has been calculated using the CKD EPI equation. This calculation has not been validated in all clinical situations. eGFR's persistently <60 mL/min signify possible Chronic Kidney Disease.    Anion gap 12 5 - 15  Hepatitis C antibody     Status: None   Collection Time: 11/11/15  2:00 PM  Result Value Ref Range   HCV Ab 0.1 0.0 - 0.9 s/co ratio    Comment: (NOTE)                                  Negative:     < 0.8                             Indeterminate: 0.8 - 0.9                                  Positive:     > 0.9 The CDC recommends that a positive HCV antibody result be followed up with a HCV Nucleic Acid Amplification test (937902). Performed At: [facility] [place], [place] [id number] Lindon Romp MD [id number]   CK     Status: Abnormal   Collection Time: 11/11/15  2:00 PM  Result Value Ref Range   Total CK 4496 (H) 49 - 397 U/L    Comment: RESULTS CONFIRMED BY MANUAL DILUTION  Glucose, capillary     Status: Abnormal   Collection Time: 11/11/15  2:37 PM  Result Value Ref Range   Glucose-Capillary 465 (H) 65 - 99 mg/dL  Glucose, capillary     Status: Abnormal   Collection Time: 11/11/15  3:42 PM  Result Value Ref Range   Glucose-Capillary 391 (H) 65 - 99 mg/dL  Glucose, capillary     Status: Abnormal   Collection Time: 11/11/15  5:15 PM  Result Value Ref Range   Glucose-Capillary 266 (H) 65 - 99 mg/dL  Basic metabolic panel     Status: Abnormal   Collection Time: 11/11/15  5:44 PM  Result Value Ref Range   Sodium 165 (HH) 135 - 145 mmol/L    Comment: CRITICAL RESULT CALLED TO, READ BACK BY AND VERIFIED WITH: DILLON,S. RN '@1821'$  ON 3.21.17 BY MCCOY,N.    Potassium 3.9 3.5 - 5.1 mmol/L   Chloride 130 (H) 101 - 111 mmol/L   CO2 23 22 -  32 mmol/L   Glucose, Bld 248 (H) 65 - 99 mg/dL   BUN 66 (H) 6 - 20 mg/dL   Creatinine, Ser 2.89 (H) 0.61 - 1.24 mg/dL   Calcium 8.6 (L) 8.9 - 10.3 mg/dL   GFR calc non Af Amer 22 (L) >60 mL/min   GFR calc Af Amer 25 (L) >60 mL/min    Comment: (NOTE) The eGFR has been calculated using the CKD EPI equation. This calculation has not been validated in all clinical situations. eGFR's persistently <60 mL/min signify possible Chronic Kidney Disease.    Anion gap 12 5 - 15  Glucose, capillary     Status: Abnormal   Collection Time: 11/11/15  6:22 PM  Result Value Ref Range   Glucose-Capillary 212 (H) 65 - 99 mg/dL  Glucose, capillary     Status: Abnormal   Collection Time: 11/11/15  7:22 PM  Result Value Ref Range   Glucose-Capillary 166 (H) 65 - 99 mg/dL  Glucose, capillary     Status: Abnormal   Collection Time: 11/11/15  8:29 PM  Result Value Ref Range   Glucose-Capillary 142 (H) 65 - 99 mg/dL  Glucose, capillary     Status: Abnormal   Collection Time: 11/11/15  9:25 PM  Result Value Ref Range   Glucose-Capillary 154 (H) 65 - 99 mg/dL  Basic metabolic panel     Status: Abnormal   Collection Time: 11/11/15  9:28 PM  Result Value Ref Range   Sodium 167 (HH) 135 - 145 mmol/L    Comment: CRITICAL RESULT CALLED TO, READ BACK BY AND VERIFIED WITH: Tyson Babinski RN @ 2218 ON 11/11/15 BY C DAVIS    Potassium 4.0 3.5 - 5.1 mmol/L   Chloride >130 (HH) 101 - 111 mmol/L    Comment: CRITICAL RESULT CALLED TO, READ BACK BY AND VERIFIED WITH: K GARCIA RN @ 2218 ON 11/11/15 BY C DAVIS    CO2 24 22 - 32 mmol/L   Glucose, Bld 175 (H) 65 - 99 mg/dL   BUN 66 (H) 6 - 20 mg/dL   Creatinine, Ser 2.79 (H) 0.61 - 1.24 mg/dL   Calcium 8.3 (L) 8.9 - 10.3 mg/dL   GFR calc non Af Amer 23 (L) >60 mL/min   GFR calc Af Amer 26 (L) >60 mL/min    Comment: (NOTE) The eGFR has been calculated using the CKD EPI equation. This calculation has not been validated in all clinical situations. eGFR's persistently <60  mL/min signify possible Chronic Kidney Disease.    Anion gap NOT CALCULATED 5 - 15  Glucose, capillary     Status: Abnormal   Collection Time: 11/11/15 10:27 PM  Result Value Ref Range   Glucose-Capillary 161 (H) 65 - 99 mg/dL   Comment 1 Notify RN   Glucose, capillary     Status: Abnormal   Collection Time: 11/11/15 11:16 PM  Result Value Ref Range   Glucose-Capillary 174 (H) 65 - 99 mg/dL  Glucose, capillary     Status: Abnormal   Collection Time: 11/12/15 12:21 AM  Result Value Ref Range   Glucose-Capillary 181 (H) 65 - 99 mg/dL  Glucose, capillary     Status: Abnormal   Collection Time: 11/12/15  1:20 AM  Result Value Ref Range   Glucose-Capillary 197 (H) 65 - 99 mg/dL  Glucose, capillary     Status: Abnormal   Collection Time: 11/12/15  2:19 AM  Result Value Ref Range   Glucose-Capillary 136 (H) 65 - 99 mg/dL  Ammonia     Status: None   Collection Time: 11/12/15  2:25 AM  Result Value Ref Range   Ammonia 21 9 - 35 umol/L  CBC     Status: Abnormal   Collection Time: 11/12/15  2:27 AM  Result Value Ref Range   WBC 14.9 (H) 4.0 - 10.5 K/uL   RBC 5.10 4.22 - 5.81 MIL/uL   Hemoglobin 16.2 13.0 - 17.0 g/dL   HCT 52.4 (H) 39.0 - 52.0 %   MCV 102.7 (H) 78.0 - 100.0 fL   MCH 31.8 26.0 - 34.0 pg   MCHC 30.9 30.0 - 36.0 g/dL   RDW 13.7 11.5 - 15.5 %   Platelets 112 (L) 150 - 400 K/uL    Comment: SPECIMEN CHECKED FOR CLOTS PLATELET COUNT CONFIRMED BY SMEAR   Basic metabolic panel     Status: Abnormal   Collection Time: 11/12/15  2:27 AM  Result Value Ref Range   Sodium 164 (HH) 135 -  145 mmol/L    Comment: CRITICAL RESULT CALLED TO, READ BACK BY AND VERIFIED WITH: Orlie Pollen, RN [phone] 11/12/15 BY A NORMAN    Potassium 3.3 (L) 3.5 - 5.1 mmol/L    Comment: DELTA CHECK NOTED REPEATED TO VERIFY    Chloride 130 (H) 101 - 111 mmol/L   CO2 22 22 - 32 mmol/L   Glucose, Bld 132 (H) 65 - 99 mg/dL   BUN 64 (H) 6 - 20 mg/dL   Creatinine, Ser 2.85 (H) 0.61 - 1.24 mg/dL    Calcium 8.3 (L) 8.9 - 10.3 mg/dL   GFR calc non Af Amer 22 (L) >60 mL/min   GFR calc Af Amer 26 (L) >60 mL/min    Comment: (NOTE) The eGFR has been calculated using the CKD EPI equation. This calculation has not been validated in all clinical situations. eGFR's persistently <60 mL/min signify possible Chronic Kidney Disease.    Anion gap 12 5 - 15  Hepatic function panel     Status: Abnormal   Collection Time: 11/12/15  2:27 AM  Result Value Ref Range   Total Protein 6.7 6.5 - 8.1 g/dL   Albumin 3.5 3.5 - 5.0 g/dL   AST 96 (H) 15 - 41 U/L   ALT 83 (H) 17 - 63 U/L   Alkaline Phosphatase 93 38 - 126 U/L   Total Bilirubin 0.9 0.3 - 1.2 mg/dL   Bilirubin, Direct 0.3 0.1 - 0.5 mg/dL   Indirect Bilirubin 0.6 0.3 - 0.9 mg/dL  Glucose, capillary     Status: Abnormal   Collection Time: 11/12/15  3:15 AM  Result Value Ref Range   Glucose-Capillary 137 (H) 65 - 99 mg/dL  Glucose, capillary     Status: Abnormal   Collection Time: 11/12/15  4:26 AM  Result Value Ref Range   Glucose-Capillary 148 (H) 65 - 99 mg/dL  Glucose, capillary     Status: Abnormal   Collection Time: 11/12/15  5:23 AM  Result Value Ref Range   Glucose-Capillary 123 (H) 65 - 99 mg/dL  Glucose, capillary     Status: Abnormal   Collection Time: 11/12/15  6:27 AM  Result Value Ref Range   Glucose-Capillary 152 (H) 65 - 99 mg/dL  Basic metabolic panel     Status: Abnormal   Collection Time: 11/12/15  7:07 AM  Result Value Ref Range   Sodium 160 (H) 135 - 145 mmol/L   Potassium 3.6 3.5 - 5.1 mmol/L   Chloride 127 (H) 101 - 111 mmol/L   CO2 21 (L) 22 - 32 mmol/L   Glucose, Bld 207 (H) 65 - 99 mg/dL   BUN 62 (H) 6 - 20 mg/dL   Creatinine, Ser 2.61 (H) 0.61 - 1.24 mg/dL   Calcium 7.7 (L) 8.9 - 10.3 mg/dL   GFR calc non Af Amer 24 (L) >60 mL/min   GFR calc Af Amer 28 (L) >60 mL/min    Comment: (NOTE) The eGFR has been calculated using the CKD EPI equation. This calculation has not been validated in all clinical  situations. eGFR's persistently <60 mL/min signify possible Chronic Kidney Disease.    Anion gap 12 5 - 15  CK     Status: Abnormal   Collection Time: 11/12/15  7:07 AM  Result Value Ref Range   Total CK 3026 (H) 49 - 397 U/L  Glucose, capillary     Status: Abnormal   Collection Time: 11/12/15  7:16 AM  Result Value Ref Range   Glucose-Capillary  169 (H) 65 - 99 mg/dL  Glucose, capillary     Status: Abnormal   Collection Time: 11/12/15  8:18 AM  Result Value Ref Range   Glucose-Capillary 197 (H) 65 - 99 mg/dL  Glucose, capillary     Status: Abnormal   Collection Time: 11/12/15  9:25 AM  Result Value Ref Range   Glucose-Capillary 218 (H) 65 - 99 mg/dL  Glucose, capillary     Status: Abnormal   Collection Time: 11/12/15 10:31 AM  Result Value Ref Range   Glucose-Capillary 199 (H) 65 - 99 mg/dL  Glucose, capillary     Status: Abnormal   Collection Time: 11/12/15 11:36 AM  Result Value Ref Range   Glucose-Capillary 186 (H) 65 - 99 mg/dL   Comment 1 Notify RN    Comment 2 Document in Chart   Glucose, capillary     Status: Abnormal   Collection Time: 11/12/15 12:54 PM  Result Value Ref Range   Glucose-Capillary 156 (H) 65 - 99 mg/dL  Glucose, capillary     Status: Abnormal   Collection Time: 11/12/15  1:57 PM  Result Value Ref Range   Glucose-Capillary 154 (H) 65 - 99 mg/dL  Glucose, capillary     Status: Abnormal   Collection Time: 11/12/15  2:51 PM  Result Value Ref Range   Glucose-Capillary 123 (H) 65 - 99 mg/dL  Glucose, capillary     Status: Abnormal   Collection Time: 11/12/15  3:44 PM  Result Value Ref Range   Glucose-Capillary 159 (H) 65 - 99 mg/dL  Glucose, capillary     Status: Abnormal   Collection Time: 11/12/15  4:45 PM  Result Value Ref Range   Glucose-Capillary 125 (H) 65 - 99 mg/dL  Basic metabolic panel     Status: Abnormal   Collection Time: 11/12/15  5:18 PM  Result Value Ref Range   Sodium 158 (H) 135 - 145 mmol/L   Potassium 3.7 3.5 - 5.1 mmol/L    Chloride 125 (H) 101 - 111 mmol/L   CO2 23 22 - 32 mmol/L   Glucose, Bld 165 (H) 65 - 99 mg/dL   BUN 54 (H) 6 - 20 mg/dL   Creatinine, Ser 2.29 (H) 0.61 - 1.24 mg/dL   Calcium 7.5 (L) 8.9 - 10.3 mg/dL   GFR calc non Af Amer 29 (L) >60 mL/min   GFR calc Af Amer 33 (L) >60 mL/min    Comment: (NOTE) The eGFR has been calculated using the CKD EPI equation. This calculation has not been validated in all clinical situations. eGFR's persistently <60 mL/min signify possible Chronic Kidney Disease.    Anion gap 10 5 - 15  Glucose, capillary     Status: Abnormal   Collection Time: 11/12/15  5:52 PM  Result Value Ref Range   Glucose-Capillary 148 (H) 65 - 99 mg/dL   Comment 1 Notify RN    Comment 2 Document in Chart   Glucose, capillary     Status: Abnormal   Collection Time: 11/12/15  7:01 PM  Result Value Ref Range   Glucose-Capillary 159 (H) 65 - 99 mg/dL  Glucose, capillary     Status: Abnormal   Collection Time: 11/12/15  8:12 PM  Result Value Ref Range   Glucose-Capillary 158 (H) 65 - 99 mg/dL  Osmolality, urine     Status: None   Collection Time: 11/12/15  8:59 PM  Result Value Ref Range   Osmolality, Ur 621 300 - 900 mOsm/kg    Comment:  Performed at [facility]  Na and K (sodium & potassium), rand urine     Status: None   Collection Time: 11/12/15  8:59 PM  Result Value Ref Range   Sodium, Ur 76 mmol/L   Potassium Urine Timed 32 mmol/L    Comment: Performed at [facility]  Chloride, urine, random     Status: None   Collection Time: 11/12/15  8:59 PM  Result Value Ref Range   Chloride Urine 72 mmol/L    Comment: Performed at [facility]  Glucose, capillary     Status: Abnormal   Collection Time: 11/12/15  9:11 PM  Result Value Ref Range   Glucose-Capillary 166 (H) 65 - 99 mg/dL  Glucose, capillary     Status: Abnormal   Collection Time: 11/13/15 12:41 AM  Result Value Ref Range   Glucose-Capillary 200 (H) 65 - 99 mg/dL  Basic metabolic panel      Status: Abnormal   Collection Time: 11/13/15  3:02 AM  Result Value Ref Range   Sodium 149 (H) 135 - 145 mmol/L    Comment: DELTA CHECK NOTED REPEATED TO VERIFY    Potassium 3.8 3.5 - 5.1 mmol/L   Chloride 122 (H) 101 - 111 mmol/L   CO2 18 (L) 22 - 32 mmol/L   Glucose, Bld 280 (H) 65 - 99 mg/dL   BUN 39 (H) 6 - 20 mg/dL   Creatinine, Ser 1.80 (H) 0.61 - 1.24 mg/dL   Calcium 7.1 (L) 8.9 - 10.3 mg/dL   GFR calc non Af Amer 38 (L) >60 mL/min   GFR calc Af Amer 44 (L) >60 mL/min    Comment: (NOTE) The eGFR has been calculated using the CKD EPI equation. This calculation has not been validated in all clinical situations. eGFR's persistently <60 mL/min signify possible Chronic Kidney Disease.    Anion gap 9 5 - 15  CBC     Status: Abnormal   Collection Time: 11/13/15  3:02 AM  Result Value Ref Range   WBC 8.4 4.0 - 10.5 K/uL   RBC 4.32 4.22 - 5.81 MIL/uL   Hemoglobin 13.8 13.0 - 17.0 g/dL   HCT 42.2 39.0 - 52.0 %   MCV 97.7 78.0 - 100.0 fL   MCH 31.9 26.0 - 34.0 pg   MCHC 32.7 30.0 - 36.0 g/dL   RDW 12.9 11.5 - 15.5 %   Platelets 54 (L) 150 - 400 K/uL    Comment: SPECIMEN CHECKED FOR CLOTS REPEATED TO VERIFY DELTA CHECK NOTED PLATELET COUNT CONFIRMED BY SMEAR   Glucose, capillary     Status: Abnormal   Collection Time: 11/13/15  8:42 AM  Result Value Ref Range   Glucose-Capillary 323 (H) 65 - 99 mg/dL   Comment 1 Notify RN    Comment 2 Document in Chart    No results found for: SDES, SPECREQUEST, CULT, REPTSTATUS Ct Head Wo Contrast  11/12/2015  CLINICAL DATA:  65 year old male with a history of HIV. Confusion and somnolence EXAM: CT HEAD WITHOUT CONTRAST TECHNIQUE: Contiguous axial images were obtained from the base of the skull through the vertex without intravenous contrast. COMPARISON:  None. FINDINGS: Unremarkable appearance of the calvarium without acute fracture or aggressive lesion. Unremarkable appearance of the scalp soft tissues. Unremarkable appearance of the  bilateral orbits. Mastoid air cells are clear. No significant paranasal sinus disease No acute intracranial hemorrhage, midline shift, or mass effect. Gray-white differentiation is maintained, without CT evidence of acute ischemia. Unremarkable configuration of  the ventricles. IMPRESSION: No CT evidence of acute intracranial abnormality. Signed, Dulcy Fanny. Earleen Newport, DO [facility] Radiology Electronically Signed   By: Corrie Mckusick D.O.   On: 11/12/2015 12:45   US Abdomen Complete  11/11/2015  CLINICAL DATA:  Abdominal pain.  Acute kidney injury.  HIV. EXAM: ABDOMEN ULTRASOUND COMPLETE COMPARISON:  None. FINDINGS: Gallbladder: No gallstones or wall thickening visualized. No sonographic Murphy sign noted by sonographer. Common bile duct: Diameter: 3 mm, within normal limits. Liver: Diffusely increased echogenicity of the hepatic parenchyma, consistent with hepatic steatosis/diffuse hepatocellular disease. A focal hypoechoic lesion is seen in the right hepatic lobe adjacent gallbladder fossa measuring approximately 1.6 cm. This could represent focal fatty sparing although other hepatic mass cannot be excluded. IVC: No abnormality visualized. Pancreas: Not well visualized due to overlying bowel gas. Spleen: Size and appearance within normal limits. Right Kidney: Length: 12.3 cm. Echogenicity within normal limits. 2 cm cyst noted in upper pole of right kidney. No mass or hydronephrosis visualized. Left Kidney: Length: 13.4 cm. Echogenicity within normal limits. No mass or hydronephrosis visualized. Abdominal aorta: No aneurysm visualized. Other findings: None. IMPRESSION: No evidence of gallstones or biliary ductal dilatation. Diffuse hepatic steatosis/ hepatocellular disease. 1.6 cm indeterminate hypoechoic lesion in the right hepatic lobe adjacent gallbladder fossa. This could represent focal fatty sparing, although hepatic neoplasm cannot be excluded. Consider abdomen  MRI for further characterization. No evidence of hydronephrosis. Electronically Signed   By: Earle Gell M.D.   On: 11/11/2015 14:06   Recent Results (from the past 240 hour(s))  MRSA PCR Screening     Status: None   Collection Time: 11/10/15  5:25 PM  Result Value Ref Range Status   MRSA by PCR NEGATIVE NEGATIVE Final    Comment:        The GeneXpert MRSA Assay (FDA approved for NASAL specimens only), is one component of a comprehensive MRSA colonization surveillance program. It is not intended to diagnose MRSA infection nor to guide or monitor treatment for MRSA infections.       11/13/2015, 9:55 AM     LOS: 3 days    Records and images were personally reviewed where available.

## 2015-11-13 NOTE — Progress Notes (Signed)
Date:  November 14, 2015 Chart reviewed for concurrent status and case management needs. Will continue to follow patient for changes and needs: Creedon Danielski, BSN, RN, CCM   [phone] 

## 2015-11-13 NOTE — Progress Notes (Signed)
PROGRESS NOTE    Micheal QuarryMichael Morales  ZOX:[id number]  DOB: May 29, 1951  DOA: 11/10/2015 PCP: Forrest MoronUEHLE, STEPHEN, MD Outpatient Specialists:   Hospital course: 65 year old male with history of HIV on HAART, HTN, depression, hypothyroid, presented to ED on 11/10/15 with history of depression over past couple of weeks PTA and could not see his psychiatrist and some question regarding compliance with medications. Patient was drowsy and unable to provide history which was obtained from his spouse. He only ate sweets, drank sweet juices and kept staring at the TV. Workup in the ED showed glucose of 1179, sodium 146, potassium 5.6, bicarbonate 19 and creatinine 2.5. Anion gap 23. He was admitted to stepdown unit for management of HONK/DKA. Since then, patient has persisting hypernatremia, hyperchloremia, acute kidney injury and altered mental status. CT head negative. Nephrology consulted for assistance with evaluation and management of acute kidney injury and electrolyte abnormalities. AKI & hypernatremia improving. ID & psychiatry consulted.   Assessment & Plan:   Acute encephalopathy - May be multifactorial related to electrolyte abnormalities (hyperglycemia, hypernatremia), acute kidney injury complicating underlying depression. - CT head without acute findings. Ammonia: Normal. UDS negative. Urine microscopy-not indicative of UTI. Chest x-ray without acute findings. TSH normal. - Due to intermittent somnolence, temporarily discontinued all psychiatric medications except when necessary Ativan and Haldol. - Treat underlying cause and monitor closely. - Mental status has improved somewhat in both per spouse's report last night and my exam this morning.  Poorly controlled DM 2 with hyperosmolar state and DKA - A1c 06/2015:6.5. No diabetic medications listed on home meds PTA. Glucose on admission >700 and AG 23. - Admitted to stepdown unit and treated per DKA protocol with aggressive IV fluids and  insulin drip. DKA had improved but anion gap metabolic acidosis recurred on 8/113/21 and IV insulin drip and IV fluids were reinitiated. - Hemoglobin A1c 3/20:11.7. - Transitioned to Lantus and SSI on 3/22 PM. Patient remains on IV D5W-adjust insulins as needed.  Hypernatremia, hyperchloremia - Unclear etiology. Only came PTA but unsure as to when he last took his medications. Difficulty monitoring strict intake and output-patient keeps pulling out condom catheter. As per nursing, no large volume urine output and hence suspect no polyuria. Patient does not complain of frequent thirst either. - He might have had hyperchloremic metabolic acidosis 3/21 as a cause of his elevated anion gap. - Nephrology consultation appreciated. Discussed with Dr. 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 He recommends continuing high-dose IV D5 at 150 mL an hour and normal saline at 150 mL an hour until further improvement in serum sodium.  Acute kidney injury - Possibly related to volume depletion. Renal ultrasound without hydronephrosis. Some degree of rhabdomyolysis may be contribute in. - Nephrology consultation appreciated. Improving.  Rhabdomyolysis - CK 4496 on 3/21. No history of fall or trauma noted. Follow CK. Continue IV fluids.  HIV - Continue HAART - 07/01/15: CD4: 542 and HIV RNA nondetectable - HIV RNA quantitative <20 on 11/10/15 - Some concern regarding patient's HIV medications contributing to acute renal failure, ID consulted and recommended continuing descovy and changed isn to dtgv  Hypothyroid - Continue IV Synthroid.  Depression/anxiety - Due to somnolence, temporarily hold BuSpar, Cymbalta, mirtazapine and use when necessary IV lorazepam for anxiety and Haldol for agitation. Patient received Haldol last night and this morning-we'll reduce dose. - Psychiatric  consulted-input pending. - Lithium level on 3/20:0.17.  Mildly  abnormal LFTs - Likely from rhabdomyolysis. Improving. - HCV antibody: 0.1.  Hypokalemia - Replaced  Thrombocytopenia - Unclear etiology. Platelets have dropped further to 54. Follow CBC closely. If further drop, consider further evaluation. No reported bleeding.  Diffuse hepatic steatosis/hepatocellular disease & 1.6 cm indeterminate hypoechoic lesion in right hepatic lobe, seen on ultrasound - Need further workup by MRI in the future.  Skin rash - Unclear etiology. No new medications started apart from insulins. Nonpruritic. No specific dermatomal localization. Probably noninfectious. Monitor closely. Follow-up in a.m.  DVT prophylaxis: SCDs Code Status: Full Family Communication: None at bedside. Discussed in detail with patient's spouse on 3/22. Disposition Plan: Remains in stepdown unit. Not medically stable for discharge.   Consultants:  Nephrology  Psychiatry - pending  Infectious Disease  Procedures:  Foley catheter 3/22 >  Antimicrobials:  Raltegravir 3/22>  Dolutegravir 3/23>  Emtricitabine 3/23>  Subjective: Patient more alert this morning. Oriented to self and partly to place. Denies complaints. Denies pain or itching. As per RN, no acute issues.  Objective: Filed Vitals:   11/13/15 0600 11/13/15 0742 11/13/15 0800 11/13/15 1200  BP: 123/75  135/61   Pulse: 108 106 106   Temp:   99.1 F (37.3 C) 99.1 F (37.3 C)  TempSrc:   Oral Axillary  Resp: 24 33 32   Height:      Weight:      SpO2: 96% 97% 95%     Intake/Output Summary (Last 24 hours) at 11/13/15 1344 Last data filed at 11/13/15 0600  Gross per 24 hour  Intake 4971.89 ml  Output   1950 ml  Net 3021.89 ml   Filed Weights   11/10/15 1722 11/11/15 0500 11/13/15 0406  Weight: 75.2 kg (165 lb 12.6 oz) 74.4 kg (164 lb 0.4 oz) 80.1 kg (176 lb 9.4 oz)    Exam:  General exam: Moderately built and nourished pleasant  middle-aged male lying comfortably supine in bed. Respiratory system: Clear. No increased work of breathing. Cardiovascular system: S1 & S2 heard, regular and mildly tachycardic. No JVD, murmurs, gallops, clicks or pedal edema. Telemetry: ST in 100's. Gastrointestinal system: Abdomen is nondistended, soft and nontender. Normal bowel sounds heard. Central nervous system: More alert this morning (received Haldol at 7:40 AM), oriented to self and partly to place. Follow some instructions.. No focal neurological deficits. No neck stiffness. Extremities: Symmetric 5 x 5 power-moving all limbs actively. Skin: Diffuse, pinhead-sized, hyperpigmented ? Petechial lesions seen on anterior posterior trunk and? Left side of forehead/face and none on extremities.   Data Reviewed: Basic Metabolic Panel:  Recent Labs Lab 11/11/15 2128 11/12/15 0227 11/12/15 0707 11/12/15 1718 11/13/15 0302  NA 167* 164* 160* 158* 149*  K 4.0 3.3* 3.6 3.7 3.8  CL >130* 130* 127* 125* 122*  CO2 24 22 21* 23 18*  GLUCOSE 175* 132* 207* 165* 280*  BUN 66* 64* 62* 54* 39*  CREATININE 2.79* 2.85* 2.61* 2.29* 1.80*  CALCIUM 8.3* 8.3* 7.7* 7.5* 7.1*   Liver Function Tests:  Recent Labs Lab 11/10/15 1104 11/12/15 0227  AST 60* 96*  ALT 111* 83*  ALKPHOS 140* 93  BILITOT 1.8* 0.9  PROT 8.7* 6.7  ALBUMIN 4.7 3.5    Recent Labs Lab 11/10/15 1104  LIPASE 62*    Recent Labs Lab 11/12/15 0225  AMMONIA 21   CBC:  Recent Labs Lab 11/10/15 1104 11/10/15 1139 11/11/15 0307 11/12/15 0227 11/13/15 0302  WBC 13.7*  --  21.3* 14.9* 8.4  NEUTROABS 12.1*  --   --   --   --  HGB 20.0* 20.4* 18.9* 16.2 13.8  HCT 59.9* 60.0* 57.2* 52.4* 42.2  MCV 98.8  --  99.1 102.7* 97.7  PLT 171  --  168 112* 54*   Cardiac Enzymes:  Recent Labs Lab 11/11/15 1400 11/12/15 0707  CKTOTAL 4496* 3026*   BNP (last 3 results) No results for input(s): PROBNP in the last 8760 hours. CBG:  Recent Labs Lab  11/12/15 2012 11/12/15 2111 11/13/15 0041 11/13/15 0842 11/13/15 1209  GLUCAP 158* 166* 200* 323* 267*    Recent Results (from the past 240 hour(s))  MRSA PCR Screening     Status: None   Collection Time: 11/10/15  5:25 PM  Result Value Ref Range Status   MRSA by PCR NEGATIVE NEGATIVE Final    Comment:        The GeneXpert MRSA Assay (FDA approved for NASAL specimens only), is one component of a comprehensive MRSA colonization surveillance program. It is not intended to diagnose MRSA infection nor to guide or monitor treatment for MRSA infections.          Studies: Ct Head Wo Contrast  11/12/2015  CLINICAL DATA:  65 year old male with a history of HIV. Confusion and somnolence EXAM: CT HEAD WITHOUT CONTRAST TECHNIQUE: Contiguous axial images were obtained from the base of the skull through the vertex without intravenous contrast. COMPARISON:  None. FINDINGS: Unremarkable appearance of the calvarium without acute fracture or aggressive lesion. Unremarkable appearance of the scalp soft tissues. Unremarkable appearance of the bilateral orbits. Mastoid air cells are clear. No significant paranasal sinus disease No acute intracranial hemorrhage, midline shift, or mass effect. Gray-white differentiation is maintained, without CT evidence of acute ischemia. Unremarkable configuration of the ventricles. IMPRESSION: No CT evidence of acute intracranial abnormality. Signed, Yvone Neu. Loreta Ave, DO [facility] Radiology Electronically Signed   By: Gilmer Mor D.O.   On: 11/12/2015 12:45   US Abdomen Complete  11/11/2015  CLINICAL DATA:  Abdominal pain.  Acute kidney injury.  HIV. EXAM: ABDOMEN ULTRASOUND COMPLETE COMPARISON:  None. FINDINGS: Gallbladder: No gallstones or wall thickening visualized. No sonographic Murphy sign noted by sonographer. Common bile duct: Diameter: 3 mm, within normal limits. Liver: Diffusely increased echogenicity of  the hepatic parenchyma, consistent with hepatic steatosis/diffuse hepatocellular disease. A focal hypoechoic lesion is seen in the right hepatic lobe adjacent gallbladder fossa measuring approximately 1.6 cm. This could represent focal fatty sparing although other hepatic mass cannot be excluded. IVC: No abnormality visualized. Pancreas: Not well visualized due to overlying bowel gas. Spleen: Size and appearance within normal limits. Right Kidney: Length: 12.3 cm. Echogenicity within normal limits. 2 cm cyst noted in upper pole of right kidney. No mass or hydronephrosis visualized. Left Kidney: Length: 13.4 cm. Echogenicity within normal limits. No mass or hydronephrosis visualized. Abdominal aorta: No aneurysm visualized. Other findings: None. IMPRESSION: No evidence of gallstones or biliary ductal dilatation. Diffuse hepatic steatosis/ hepatocellular disease. 1.6 cm indeterminate hypoechoic lesion in the right hepatic lobe adjacent gallbladder fossa. This could represent focal fatty sparing, although hepatic neoplasm cannot be excluded. Consider abdomen MRI for further characterization. No evidence of hydronephrosis. Electronically Signed   By: Myles Rosenthal M.D.   On: 11/11/2015 14:06        Scheduled Meds: . dolutegravir  50 mg Oral Daily  . [START ON 11/14/2015] emtricitabine-tenofovir AF  1 tablet Oral Daily  . insulin aspart  0-15 Units Subcutaneous TID WC  . insulin aspart  0-5 Units Subcutaneous QHS  . insulin  glargine  20 Units Subcutaneous QHS  . levothyroxine  25 mcg Intravenous Daily  . sodium chloride flush  3 mL Intravenous Q12H   Continuous Infusions: . sodium chloride 150 mL/hr at 11/13/15 0745  . dextrose 150 mL/hr at 11/13/15 1009    Principal Problem:   Bipolar I disorder, most recent episode depressed (HCC) Active Problems:   HIV DISEASE   History of hepatitis B   Acute encephalopathy   Uncontrolled type 2 DM with hyperosmolar nonketotic hyperglycemia (HCC)   High anion  gap metabolic acidosis   Depression   AKI (acute kidney injury) (HCC)   Hyperosmolar (nonketotic) coma (HCC)   Protein-calorie malnutrition, severe    Time spent: 40 minutes.    Marcellus Scott, MD, FACP, FHM. [facility] Pager [phone]  If 7PM-7AM, please contact night-coverage [email or web address] Password [email or web address] 11/13/2015, 1:44 PM    LOS: 3 days

## 2015-11-14 DIAGNOSIS — F3130 Bipolar disorder, current episode depressed, mild or moderate severity, unspecified: Secondary | ICD-10-CM

## 2015-11-14 LAB — CK: CK TOTAL: 608 U/L — AB (ref 49–397)

## 2015-11-14 LAB — BASIC METABOLIC PANEL
Anion gap: 8 (ref 5–15)
BUN: 21 mg/dL — AB (ref 6–20)
CALCIUM: 7.5 mg/dL — AB (ref 8.9–10.3)
CO2: 20 mmol/L — ABNORMAL LOW (ref 22–32)
CREATININE: 1.56 mg/dL — AB (ref 0.61–1.24)
Chloride: 115 mmol/L — ABNORMAL HIGH (ref 101–111)
GFR, EST AFRICAN AMERICAN: 53 mL/min — AB (ref >60)
GFR, EST NON AFRICAN AMERICAN: 46 mL/min — AB (ref >60)
Glucose, Bld: 243 mg/dL — ABNORMAL HIGH (ref 65–99)
Potassium: 3.5 mmol/L (ref 3.5–5.1)
SODIUM: 143 mmol/L (ref 135–145)

## 2015-11-14 LAB — CBC WITH DIFFERENTIAL/PLATELET
BASOS ABS: 0 10*3/uL (ref 0.0–0.1)
BASOS PCT: 0 %
EOS ABS: 0.1 10*3/uL (ref 0.0–0.7)
EOS PCT: 2 %
HCT: 39.3 % (ref 39.0–52.0)
Hemoglobin: 12.9 g/dL — ABNORMAL LOW (ref 13.0–17.0)
Lymphocytes Relative: 14 %
Lymphs Abs: 0.8 10*3/uL (ref 0.7–4.0)
MCH: 32.2 pg (ref 26.0–34.0)
MCHC: 32.8 g/dL (ref 30.0–36.0)
MCV: 98 fL (ref 78.0–100.0)
Monocytes Absolute: 0.4 10*3/uL (ref 0.1–1.0)
Monocytes Relative: 7 %
NEUTROS PCT: 77 %
Neutro Abs: 4.7 10*3/uL (ref 1.7–7.7)
PLATELETS: 52 10*3/uL — AB (ref 150–400)
RBC: 4.01 MIL/uL — AB (ref 4.22–5.81)
RDW: 12.7 % (ref 11.5–15.5)
WBC: 6 10*3/uL (ref 4.0–10.5)

## 2015-11-14 LAB — T-HELPER CELLS (CD4) COUNT (NOT AT ARMC)
CD4 % Helper T Cell: 52 % (ref 33–55)
CD4 T Cell Abs: 600 /uL (ref 400–2700)

## 2015-11-14 LAB — PHOSPHORUS: Phosphorus: 2 mg/dL — ABNORMAL LOW (ref 2.5–4.6)

## 2015-11-14 LAB — HIV-1 RNA QUANT-NO REFLEX-BLD
HIV 1 RNA Quant: <20 copies/mL
LOG10 HIV-1 RNA: UNDETERMINED {Log_copies}/mL

## 2015-11-14 LAB — GLUCOSE, CAPILLARY
GLUCOSE-CAPILLARY: 258 mg/dL — AB (ref 65–99)
Glucose-Capillary: 218 mg/dL — ABNORMAL HIGH (ref 65–99)
Glucose-Capillary: 216 mg/dL — ABNORMAL HIGH (ref 65–99)
Glucose-Capillary: 136 mg/dL — ABNORMAL HIGH (ref 65–99)

## 2015-11-14 LAB — PROTIME-INR
INR: 1.68 — AB (ref 0.00–1.49)
PROTHROMBIN TIME: 19.2 s — AB (ref 11.6–15.2)

## 2015-11-14 MED ORDER — LIVING WELL WITH DIABETES BOOK
Freq: Once | Status: AC
  Administered 2015-11-14: 11:00:00
  Filled 2015-11-14: qty 1

## 2015-11-14 MED ORDER — LEVOTHYROXINE SODIUM 50 MCG PO TABS
50.0000 ug | ORAL_TABLET | Freq: Every day | ORAL | Status: DC
  Administered 2015-11-15 – 2015-11-17 (×3): 50 ug via ORAL
  Filled 2015-11-14 (×4): qty 1

## 2015-11-14 MED ORDER — INSULIN STARTER KIT- PEN NEEDLES (ENGLISH)
1.0000 | Freq: Once | Status: AC
  Administered 2015-11-14: 1
  Filled 2015-11-14: qty 1

## 2015-11-14 MED ORDER — K PHOS MONO-SOD PHOS DI & MONO 155-852-130 MG PO TABS
250.0000 mg | ORAL_TABLET | Freq: Three times a day (TID) | ORAL | Status: DC
  Administered 2015-11-14 – 2015-11-15 (×2): 250 mg via ORAL
  Filled 2015-11-14 (×4): qty 1

## 2015-11-14 NOTE — Progress Notes (Signed)
INFECTIOUS DISEASE PROGRESS NOTE  ID: Micheal QuarryMichael Morales is a 65 y.o. male with  Principal Problem:   Bipolar I disorder, most recent episode depressed (HCC) Active Problems:   HIV DISEASE   History of hepatitis B   Acute encephalopathy   Uncontrolled type 2 DM with hyperosmolar nonketotic hyperglycemia (HCC)   High anion gap metabolic acidosis   Depression   AKI (acute kidney injury) (HCC)   Hyperosmolar (nonketotic) coma (HCC)   Protein-calorie malnutrition, severe  Subjective: Resting quietly.   Abtx:  Anti-infectives    Start     Dose/Rate Route Frequency Ordered Stop   11/14/15 1000  emtricitabine-tenofovir AF (DESCOVY) 200-25 MG per tablet 1 tablet     1 tablet Oral Daily 11/13/15 1027     11/13/15 1200  dolutegravir (TIVICAY) tablet 50 mg     50 mg Oral Daily 11/13/15 1027     11/13/15 1000  emtricitabine (EMTRIVA) capsule 200 mg  Status:  Discontinued     200 mg Oral Every 48 hours 11/12/15 1924 11/13/15 1027   11/10/15 2200  raltegravir (ISENTRESS) tablet 400 mg  Status:  Discontinued     400 mg Oral 2 times daily 11/10/15 1818 11/13/15 1027   11/10/15 2000  emtricitabine-tenofovir AF (DESCOVY) 200-25 MG per tablet 1 tablet  Status:  Discontinued     1 tablet Oral Daily 11/10/15 1838 11/12/15 1923   11/10/15 1830  emtricitabine-tenofovir AF (DESCOVY) 200-25 MG per tablet 1 tablet  Status:  Discontinued     1 tablet Oral Daily 11/10/15 1818 11/10/15 1838      Medications:  Scheduled: . dolutegravir  50 mg Oral Daily  . emtricitabine-tenofovir AF  1 tablet Oral Daily  . insulin aspart  0-15 Units Subcutaneous TID WC  . insulin aspart  0-5 Units Subcutaneous QHS  . insulin glargine  20 Units Subcutaneous QHS  . [START ON 11/15/2015] levothyroxine  50 mcg Oral QAC breakfast  . phosphorus  250 mg Oral TID  . sodium chloride flush  3 mL Intravenous Q12H    Objective: Vital signs in last 24 hours: Temp:  [97.8 F (36.6 C)-100.7 F (38.2 C)] 97.8 F (36.6 C)  (03/24 1516) Pulse Rate:  [86-109] 96 (03/24 1516) Resp:  [22-33] 22 (03/24 1516) BP: (115-145)/(50-72) 133/70 mmHg (03/24 1516) SpO2:  [96 %-100 %] 98 % (03/24 1516) Weight:  [82.1 kg (181 lb)] 82.1 kg (181 lb) (03/24 0312)   General appearance: fatigued and no distress Resp: clear to auscultation bilaterally Cardio: regular rate and rhythm GI: normal findings: bowel sounds normal and soft, non-tender Skin: macular - generalized  Lab Results  Recent Labs  11/13/15 0302 11/13/15 1816 11/14/15 0309  WBC 8.4  --  6.0  HGB 13.8  --  12.9*  HCT 42.2  --  39.3  NA 149* 145 143  K 3.8 3.4* 3.5  CL 122* 117* 115*  CO2 18* 20* 20*  BUN 39* 27* 21*  CREATININE 1.80* 1.58* 1.56*   Liver Panel  Recent Labs  11/12/15 0227  PROT 6.7  ALBUMIN 3.5  AST 96*  ALT 83*  ALKPHOS 93  BILITOT 0.9  BILIDIR 0.3  IBILI 0.6   Sedimentation Rate No results for input(s): ESRSEDRATE in the last 72 hours. C-Reactive Protein No results for input(s): CRP in the last 72 hours.  Microbiology: Recent Results (from the past 240 hour(s))  MRSA PCR Screening     Status: None   Collection Time: 11/10/15  5:25 PM  Result Value Ref Range Status   MRSA by PCR NEGATIVE NEGATIVE Final    Comment:        The GeneXpert MRSA Assay (FDA approved for NASAL specimens only), is one component of a comprehensive MRSA colonization surveillance program. It is not intended to diagnose MRSA infection nor to guide or monitor treatment for MRSA infections.     Studies/Results: No results found.   Assessment/Plan: HIV+ Continue descovy/dtgv Discussed with his PCP (Dr Daiva Eves)  Newly Dx DM, DKA Hydration, insulin mgmt per primary team.  Glc appears to be improving.   Generalized rash Consider skin bx.   ARF improving  Hypothyroid  Bipolar  Watch his temps, low grade o/n.   Dr Orvan Falconer available if questions over weekend.          Micheal Morales Infectious Diseases (pager)  [phone] [email or web address] 11/14/2015, 5:06 PM  LOS: 4 days

## 2015-11-14 NOTE — Progress Notes (Signed)
Inpatient Diabetes Program Recommendations  AACE/ADA: New Consensus Statement on Inpatient Glycemic Control (2015)  Target Ranges:  Prepandial:   less than 140 mg/dL      Peak postprandial:   less than 180 mg/dL (1-2 hours)      Critically ill patients:  140 - 180 mg/dL    Current orders for Inpatient glycemic control: Lantus 20 q hs + Novolog Moderate correction scale 0-15  Inpatient Diabetes Program Recommendations:  Noted CBGs continue to be elevated requiring SSI and fasting CBG remains high 243. Please consider increase of Lantus to 25 units q hs (0.3 units/kg.).  Thank you, Micheal FischerJudy E. Safari Cinque, RN, MSN, CDE Inpatient Glycemic Control Team Team Pager [phone] (8am-5pm) 11/14/2015 10:59 AM

## 2015-11-14 NOTE — Progress Notes (Signed)
[facility] Progress Note   Subjective: more alert, Na 143 and creat 1.5  Filed Vitals:   11/14/15 0600 11/14/15 0800 11/14/15 1000 11/14/15 1200  BP: 143/65 145/65 117/56   Pulse: 94 88 97   Temp:  97.9 F (36.6 C)  99.3 F (37.4 C)  TempSrc:  Oral  Oral  Resp:   30   Height:      Weight:      SpO2: 99% 100% 96%     Inpatient medications: . dolutegravir  50 mg Oral Daily  . emtricitabine-tenofovir AF  1 tablet Oral Daily  . insulin aspart  0-15 Units Subcutaneous TID WC  . insulin aspart  0-5 Units Subcutaneous QHS  . insulin glargine  20 Units Subcutaneous QHS  . [START ON 11/15/2015] levothyroxine  50 mcg Oral QAC breakfast  . phosphorus  250 mg Oral TID  . sodium chloride flush  3 mL Intravenous Q12H   . sodium chloride 75 mL/hr at 11/14/15 1022   haloperidol lactate, LORazepam  Exam: Looks much better  No jvd or bruits Chest clear bilat RRR no MRG Abd soft ntnd no mass, liver enlarged liver edge down 6 cm GU normal male MS no joint effusions or deformity Ext no edema / wounds/ ulcers Neuro is stuporous, nonfocal  Abd [place] > liver diffuse ^'d echo, hepatic steatosis vs hepatocellular disease. Kidneys echogenic, no hydro, no mass, 12-14 cm.  UA > glu >1000, mod ketones, yellow, hyaline casts, 30 prot, 1.037, pH 5.0, 0-5 WBC, 0-5 rbc Last creat - Jul 11, 2015 = 1.02 UNa- 76    Uosm - 621  CXR - cleat bilat  Home meds > buspar, cymbalta, isentress, synthroid, Lithium carb 300 tid, ativan prn, Remeron, Ruvada 200-300 (emtricitabine-tenofovir), risperidone, restoril prn   Assessment: 1 Renal failure, acute and/or chronic - creat down 1.5, baseline Nov '16 was 1.0.  Tenofovir changed to "renal-safe" form.  Don't know for sure if tenofovir involved in renal toxicity, though low serum phos and met acidosis would be consistent with prox tubular damage.  This is not neph DI so lithium does not need to be held for water issues, but if creat doesn't  improve back to baseline I would try to avoid both lithium and regular tenofovir.  2 Hypernatremia - from osmotic diuresis related to uncont DM 3 HIV unclear how long this dx 4 AMS - improving 5 Depression 6 Vol depletion - better 7 Hepatic steatosis (diffuse)/ hepatocellular disease - can be seen with HIV meds               Plan - can stop D5W now and just give saline at 75 / hr for another day perhaps. Is eating now.  Will sign off.    Kelly Splinter MD [facility] pager [phone]    cell [phone] 11/14/2015, 1:52 PM    Recent Labs Lab 11/13/15 0302 11/13/15 1816 11/14/15 0309  NA 149* 145 143  K 3.8 3.4* 3.5  CL 122* 117* 115*  CO2 18* 20* 20*  GLUCOSE 280* 222* 243*  BUN 39* 27* 21*  CREATININE 1.80* 1.58* 1.56*  CALCIUM 7.1* 7.2* 7.5*  PHOS  --   --  2.0*    Recent Labs Lab 11/10/15 1104 11/12/15 0227  AST 60* 96*  ALT 111* 83*  ALKPHOS 140* 93  BILITOT 1.8* 0.9  PROT 8.7* 6.7  ALBUMIN 4.7 3.5    Recent Labs Lab 11/10/15 1104  11/12/15 0227 11/13/15 0302 11/14/15 0309  WBC 13.7*  < > 14.9* 8.4 6.0  NEUTROABS 12.1*  --   --   --  4.7  HGB 20.0*  < > 16.2 13.8 12.9*  HCT 59.9*  < > 52.4* 42.2 39.3  MCV 98.8  < > 102.7* 97.7 98.0  PLT 171  < > 112* 54* 52*  < > = values in this interval not displayed.

## 2015-11-14 NOTE — Progress Notes (Addendum)
RN sitting outside of patient room when bed alarm sounded.  Patient found on fall mat to right side of bed on hands and knees.  Patient reports no pain.  No injury found.  Vitals obtained.  Fall spreadsheet completed.  Wife, Lebron ConnersMarcy, notified.  Dr. Waymon AmatoHongalgi notified.    Was the fall witnessed: no  Patient condition before and after the fall: oriented x 2, with poor concentration and memory.    Patient's reaction to the fall:  Reports no pain.  No visible injury.  Patient continued to attempt to get to chair.    Name of the doctor that was notified including date and time: Dr. Waymon AmatoHongalgi 11/14/15  Any interventions and vital signs: vital signs obtained, patient assessed.

## 2015-11-14 NOTE — Progress Notes (Signed)
Pt stable for transfer to [facility]. Full report given to receiving RN. VSS.  As per MD Hongalgi's request, release of medical records faxed to [facility] (Office: [phone], Fax: [phone]). Pt unable to sign consent. Pt's wife was not at bedside. Wife was called and she gave consent for her husband's medical records to be released by [facility]. This RN Georgiann Cocker(Renard Caperton) and second RN Edrick OhJeannine Fishel confirmed wife's verbal consent over the phone.

## 2015-11-14 NOTE — Progress Notes (Addendum)
PROGRESS NOTE    Micheal Morales  JSE:[id number]  DOB: 1951-02-02  DOA: 11/10/2015 PCP: Charleston Poot, MD Outpatient Specialists: Infectious disease: Dr. Rhina [facility] Psychiatry: Comer Locket, NP at [facility].   Hospital course: 65 year old male with history of HIV on HAART, HTN, depression, hypothyroid, presented to ED on 11/10/15 with history of depression over past couple of weeks PTA and could not see his psychiatrist and some question regarding compliance with medications. Patient was drowsy and unable to provide history which was obtained from his spouse. He only ate sweets, drank sweet juices and kept staring at the TV. Workup in the ED showed glucose of 1179, sodium 146, potassium 5.6, bicarbonate 19 and creatinine 2.5. Anion gap 23. He was admitted to stepdown unit for management of HONK/DKA. Since then, patient has persisting hypernatremia, hyperchloremia, acute kidney injury and altered mental status. CT head negative. Nephrology consulted for assistance with evaluation and management of acute kidney injury and electrolyte abnormalities. AKI & hypernatremia improving. ID & psychiatry consulted. Mental status has improved. Creatinine and hypernatremia have improved. Transfer to medical floor 3/24. Possible DC home in the next 48 hours.   Assessment & Plan:   Acute encephalopathy - May be multifactorial related to electrolyte abnormalities (hyperglycemia, hypernatremia), acute kidney injury complicating underlying depression. - CT head without acute findings. Ammonia: Normal. UDS negative. Urine microscopy-not indicative of UTI. Chest x-ray without acute findings. TSH normal. - Due to intermittent somnolence, temporarily discontinued all psychiatric medications except when necessary Ativan and Haldol. - Treat underlying cause and monitor closely. - Mental status has improved. Alert, some oriented and answers some questions appropriately.  Poorly controlled DM 2  with hyperosmolar state and DKA - A1c 06/2015:6.5. No diabetic medications listed on home meds PTA. Glucose on admission >700 and AG 23. - Admitted to stepdown unit and treated per DKA protocol with aggressive IV fluids and insulin drip. DKA had improved but anion gap metabolic acidosis recurred on 3/21 and IV insulin drip and IV fluids were reinitiated. - Hemoglobin A1c 3/20:11.7. - Transitioned to Lantus and SSI on 3/22 PM.  - CBGs in the 200s. D5 IV infusion probably contributing and hence discontinued this morning. Monitor on current doses of Lantus 20 units at bedtime and moderate NovoLog SSI. May need further adjustment of insulins.  Hypernatremia, hyperchloremia - Unclear etiology. Only came PTA but unsure as to when he last took his medications. Difficulty monitoring strict intake and output-patient keeps pulling out condom catheter. As per nursing, no large volume urine output and hence suspect no polyuria. Patient does not complain of frequent thirst either. - He might have had hyperchloremic metabolic acidosis 0/73 as a cause of his elevated anion gap. - Nephrology consultation appreciated. Discussed with Dr. 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  - Patient was treated aggressively with IV normal saline boluses/maintenance and IV D5W for almost 48 hours. Hypernatremia has resolved. Discussed with nephrology 3/24-may discontinue D5W, continue normal saline for additional 24 hours at reduced rate until oral intake consistently increases.  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 Renal ultrasound without hydronephrosis. Some degree of rhabdomyolysis may be contribute in. - Nephrology consultation appreciated. Improving. Baseline creatinine may be in the 1.5 range.  Rhabdomyolysis - CK 4496 on  3/21. No history of fall or trauma noted. Treated with IV fluid. CK down to 608.  Improved.  HIV - Continue HAART - 07/01/15: CD4: 542 and HIV RNA nondetectable - HIV RNA quantitative <20 on 11/10/15 - Some concern regarding patient's HIV medications contributing to acute renal failure, ID consulted and recommended continuing descovy and changed isn to dtgv  Hypothyroid - Change Synthroid to oral now that patient is tolerating diet.  Depression/anxiety - Due to somnolence, temporarily hold BuSpar, Cymbalta, mirtazapine and use when necessary IV lorazepam for anxiety and Haldol for agitation.  - Psychiatric consultation appreciated-await follow-up to consider resuming some of his psychiatric medications. - Lithium level on 3/20:0.17.  Mildly abnormal LFTs - Likely from rhabdomyolysis. Improving. - HCV antibody: 0.1.  Hypokalemia - Replaced  Hypophosphatemia - Replace and follow.  Thrombocytopenia - Unclear etiology. Platelets have dropped further to 54 on 3/23. Follow CBC closely. If further drop, consider further evaluation. No reported bleeding. - Stable over the last 24 hours. Continue following CBCs.  Diffuse hepatic steatosis/hepatocellular disease & 1.6 cm indeterminate hypoechoic lesion in right hepatic lobe, seen on ultrasound - Need further workup by MRI in the future.  Skin rash - Unclear etiology. No new medications started apart from insulins. Nonpruritic. No specific dermatomal localization. Probably noninfectious. Monitor closely. Skin rash on anterior trunk has significantly improved. Persisting on back-try hydrocortisone topical.  Mechanical fall - As per RN, on 3/24 AM, patient attempted to get out of bed and fell next to his bed without sustaining any injuries. 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  DVT prophylaxis: SCDs Code Status: Full Family Communication: None at bedside.  Disposition Plan: Admitted initially to stepdown bed. Transfer to medical bed 11/14/15. DC  home possibly in the next 48 hours.   Consultants:  Nephrology  Psychiatry  Infectious Disease  Procedures:  Foley catheter 3/22 >3/24  Antimicrobials:  Raltegravir 3/22>  Dolutegravir 3/23>  Emtricitabine 3/23>  Subjective: Alert this morning. Asking to go home. Denies complaints. Denies dyspnea. As per RN, alert and still slightly confused. Also early this morning, patient was trying to get out of bed and fell next to his bed without sustaining any injuries.  Objective: Filed Vitals:   11/14/15 0400 11/14/15 0600 11/14/15 0800 11/14/15 1000  BP: 138/72 143/65 145/65 117/56  Pulse: 89 94 88 97  Temp:   97.9 F (36.6 C)   TempSrc:   Oral   Resp: 24   30  Height:      Weight:      SpO2: 99% 99% 100% 96%    Intake/Output Summary (Last 24 hours) at 11/14/15 1242 Last data filed at 11/14/15 0845  Gross per 24 hour  Intake 5967.75 ml  Output   7300 ml  Net -1332.25 ml   Filed Weights   11/11/15 0500 11/13/15 0406 11/14/15 0312  Weight: 74.4 kg (164 lb 0.4 oz) 80.1 kg (176 lb 9.4 oz) 82.1 kg (181 lb)    Exam:  General exam: Moderately built and nourished pleasant middle-aged male sitting up in bed eating breakfast this morning. Looks much improved compared to 48-72 hours ago. Respiratory system: Slightly diminished breath sounds in the bases with occasional basal crackles but otherwise clear to auscultation. No increased work of breathing. Cardiovascular system: S1 & S2 heard, RRR. No JVD, murmurs, gallops, clicks or pedal edema. Telemetry: SR. Gastrointestinal system: Abdomen is nondistended, soft and nontender. Normal bowel sounds heard. Central nervous system: Alert, oriented to self, partly to place, follows instructions appropriately. No focal neurological deficits. No neck stiffness. Extremities: Symmetric 5 x 5 power-moving all limbs actively. Skin: Diffuse, pinhead-sized,  hyperpigmented ? Petechial lesions seen on anterior posterior trunk and? Left side  of forehead/face and none on extremities. Rash on anterior trunk is significantly improved. Persisting on back.    Data Reviewed: Basic Metabolic Panel:  Recent Labs Lab 11/12/15 0707 11/12/15 1718 11/13/15 0302 11/13/15 1816 11/14/15 0309  NA 160* 158* 149* 145 143  K 3.6 3.7 3.8 3.4* 3.5  CL 127* 125* 122* 117* 115*  CO2 21* 23 18* 20* 20*  GLUCOSE 207* 165* 280* 222* 243*  BUN 62* 54* 39* 27* 21*  CREATININE 2.61* 2.29* 1.80* 1.58* 1.56*  CALCIUM 7.7* 7.5* 7.1* 7.2* 7.5*  PHOS  --   --   --   --  2.0*   Liver Function Tests:  Recent Labs Lab 11/10/15 1104 11/12/15 0227  AST 60* 96*  ALT 111* 83*  ALKPHOS 140* 93  BILITOT 1.8* 0.9  PROT 8.7* 6.7  ALBUMIN 4.7 3.5    Recent Labs Lab 11/10/15 1104  LIPASE 62*    Recent Labs Lab 11/12/15 0225  AMMONIA 21   CBC:  Recent Labs Lab 11/10/15 1104 11/10/15 1139 11/11/15 0307 11/12/15 0227 11/13/15 0302 11/14/15 0309  WBC 13.7*  --  21.3* 14.9* 8.4 6.0  NEUTROABS 12.1*  --   --   --   --  4.7  HGB 20.0* 20.4* 18.9* 16.2 13.8 12.9*  HCT 59.9* 60.0* 57.2* 52.4* 42.2 39.3  MCV 98.8  --  99.1 102.7* 97.7 98.0  PLT 171  --  168 112* 54* 52*   Cardiac Enzymes:  Recent Labs Lab 11/11/15 1400 11/12/15 0707 11/14/15 0309  CKTOTAL 4496* 3026* 608*   BNP (last 3 results) No results for input(s): PROBNP in the last 8760 hours. CBG:  Recent Labs Lab 11/13/15 1209 11/13/15 1603 11/13/15 2108 11/14/15 0825 11/14/15 1146  GLUCAP 267* 222* 215* 218* 216*    Recent Results (from the past 240 hour(s))  MRSA PCR Screening     Status: None   Collection Time: 11/10/15  5:25 PM  Result Value Ref Range Status   MRSA by PCR NEGATIVE NEGATIVE Final    Comment:        The GeneXpert MRSA Assay (FDA approved for NASAL specimens only), is one component of a comprehensive MRSA colonization surveillance program. It is not intended to diagnose MRSA infection nor to guide or monitor treatment for MRSA  infections.          Studies: No results found.      Scheduled Meds: . dolutegravir  50 mg Oral Daily  . emtricitabine-tenofovir AF  1 tablet Oral Daily  . insulin aspart  0-15 Units Subcutaneous TID WC  . insulin aspart  0-5 Units Subcutaneous QHS  . insulin glargine  20 Units Subcutaneous QHS  . insulin starter kit- pen needles  1 kit Other Once  . [START ON 11/15/2015] levothyroxine  50 mcg Oral QAC breakfast  . living well with diabetes book   Does not apply Once  . phosphorus  250 mg Oral TID  . sodium chloride flush  3 mL Intravenous Q12H   Continuous Infusions: . sodium chloride 75 mL/hr at 11/14/15 1022    Principal Problem:   Bipolar I disorder, most recent episode depressed (HCC) Active Problems:   HIV DISEASE   History of hepatitis B   Acute encephalopathy   Uncontrolled type 2 DM with hyperosmolar nonketotic hyperglycemia (HCC)   High anion gap metabolic acidosis   Depression   AKI (acute kidney injury) (HCC)  Hyperosmolar (nonketotic) coma (HCC)   Protein-calorie malnutrition, severe    Time spent: 40 minutes.    Vernell Leep, MD, FACP, FHM. [facility] Pager [phone]  If 7PM-7AM, please contact night-coverage [email or web address] Password [email or web address] 11/14/2015, 12:42 PM    LOS: 4 days

## 2015-11-14 NOTE — Progress Notes (Signed)
Spoke with pt's wife over the phone about pt's new diagnosis of DM.  Discussed A1C results with her and explained what an A1C is, basic pathophysiology of DM Type 2, basic home care, basic diabetes diet nutrition principles, importance of checking CBGs and maintaining good CBG control to prevent long-term and short-term complications.  Reviewed signs and symptoms of hyperglycemia and hypoglycemia and how to treat hypoglycemia at home.  Also reviewed blood sugar goals at home.    RNs to provide ongoing basic DM education at bedside with this patient.  Have ordered educational booklet, insulin starter kit, and DM videos.  Have also placed RD consult for DM diet education for this patient.  Most of DM education will need to be completed with wife present as pt still having AMS.  Spoke with RN caring for pt and asked her to please begin basic DM education with wife when she gets here.  --Will follow patient during hospitalization--  Wyn Quaker RN, MSN, CDE Diabetes Coordinator Inpatient Glycemic Control Team Team Pager: [phone] (8a-5p)

## 2015-11-14 NOTE — Progress Notes (Signed)
Patient arrived from ICU via bed.  Patient oriented to new room, 1503, staff, and unit.  Patient stated understanding.  No acute distress, no concerns at this time.  Patient alert to self and place.  No family at bedside.  Vitals obtained and patient assessed.  Placed on telemetry box 69.  Will continue to monitor.

## 2015-11-15 DIAGNOSIS — E870 Hyperosmolality and hypernatremia: Secondary | ICD-10-CM | POA: Insufficient documentation

## 2015-11-15 DIAGNOSIS — G9340 Encephalopathy, unspecified: Secondary | ICD-10-CM | POA: Insufficient documentation

## 2015-11-15 DIAGNOSIS — E876 Hypokalemia: Secondary | ICD-10-CM | POA: Insufficient documentation

## 2015-11-15 LAB — GLUCOSE, CAPILLARY
GLUCOSE-CAPILLARY: 159 mg/dL — AB (ref 65–99)
GLUCOSE-CAPILLARY: 265 mg/dL — AB (ref 65–99)
GLUCOSE-CAPILLARY: 198 mg/dL — AB (ref 65–99)
GLUCOSE-CAPILLARY: 276 mg/dL — AB (ref 65–99)
Glucose-Capillary: 118 mg/dL — ABNORMAL HIGH (ref 65–99)

## 2015-11-15 LAB — COMPREHENSIVE METABOLIC PANEL
ALBUMIN: 2.8 g/dL — AB (ref 3.5–5.0)
ALK PHOS: 97 U/L (ref 38–126)
ALT: 74 U/L — AB (ref 17–63)
AST: 74 U/L — ABNORMAL HIGH (ref 15–41)
Anion gap: 11 (ref 5–15)
BUN: 16 mg/dL (ref 6–20)
CALCIUM: 8.1 mg/dL — AB (ref 8.9–10.3)
CO2: 21 mmol/L — AB (ref 22–32)
CREATININE: 1.41 mg/dL — AB (ref 0.61–1.24)
Chloride: 117 mmol/L — ABNORMAL HIGH (ref 101–111)
GFR calc Af Amer: 60 mL/min — ABNORMAL LOW (ref >60)
GFR calc non Af Amer: 52 mL/min — ABNORMAL LOW (ref >60)
GLUCOSE: 145 mg/dL — AB (ref 65–99)
Potassium: 3.4 mmol/L — ABNORMAL LOW (ref 3.5–5.1)
SODIUM: 149 mmol/L — AB (ref 135–145)
Total Bilirubin: 1.4 mg/dL — ABNORMAL HIGH (ref 0.3–1.2)
Total Protein: 6.5 g/dL (ref 6.5–8.1)

## 2015-11-15 LAB — CK: Total CK: 246 U/L (ref 49–397)

## 2015-11-15 LAB — CBC
HEMATOCRIT: 41.5 % (ref 39.0–52.0)
HEMOGLOBIN: 13.3 g/dL (ref 13.0–17.0)
MCH: 31 pg (ref 26.0–34.0)
MCHC: 32 g/dL (ref 30.0–36.0)
MCV: 96.7 fL (ref 78.0–100.0)
Platelets: 62 10*3/uL — ABNORMAL LOW (ref 150–400)
RBC: 4.29 MIL/uL (ref 4.22–5.81)
RDW: 12.6 % (ref 11.5–15.5)
WBC: 6.5 10*3/uL (ref 4.0–10.5)

## 2015-11-15 LAB — PHOSPHORUS: Phosphorus: 2.7 mg/dL (ref 2.5–4.6)

## 2015-11-15 MED ORDER — DEXTROSE 5 % IV SOLN
INTRAVENOUS | Status: DC
  Administered 2015-11-15: 09:00:00 via INTRAVENOUS

## 2015-11-15 MED ORDER — FREE WATER
200.0000 mL | Status: DC
Start: 2015-11-15 — End: 2015-11-17
  Administered 2015-11-15 – 2015-11-17 (×9): 200 mL via ORAL

## 2015-11-15 MED ORDER — CLONAZEPAM 0.5 MG PO TABS
0.5000 mg | ORAL_TABLET | Freq: Two times a day (BID) | ORAL | Status: DC
  Administered 2015-11-15 – 2015-11-17 (×5): 0.5 mg via ORAL
  Filled 2015-11-15 (×5): qty 1

## 2015-11-15 MED ORDER — DULOXETINE HCL 30 MG PO CPEP
30.0000 mg | ORAL_CAPSULE | Freq: Every day | ORAL | Status: DC
  Administered 2015-11-15 – 2015-11-17 (×3): 30 mg via ORAL
  Filled 2015-11-15 (×3): qty 1

## 2015-11-15 MED ORDER — POTASSIUM CHLORIDE CRYS ER 20 MEQ PO TBCR
40.0000 meq | EXTENDED_RELEASE_TABLET | Freq: Once | ORAL | Status: AC
  Administered 2015-11-15: 40 meq via ORAL
  Filled 2015-11-15: qty 2

## 2015-11-15 NOTE — Progress Notes (Signed)
PROGRESS NOTE    Micheal Morales  ZOX:[id number]  DOB: September 12, 1950  DOA: 11/10/2015 PCP: Forrest MoronUEHLE, STEPHEN, MD Outpatient Specialists: Infectious disease: Dr. Paulette Blanchornelius [facility] Psychiatry: Anne Fulay Shugart, NP at [facility].   Hospital course: 65 year old male with history of HIV on HAART, HTN, depression, hypothyroid, presented to ED on 11/10/15 with history of depression over past couple of weeks PTA and could not see his psychiatrist and some question regarding compliance with medications. Patient was drowsy and unable to provide history which was obtained from his spouse. He only ate sweets, drank sweet juices and kept staring at the TV. Workup in the ED showed glucose of 1179, sodium 146, potassium 5.6, bicarbonate 19 and creatinine 2.5. Anion gap 23. He was admitted to stepdown unit for management of HONK/DKA. Since then, patient has persisting hypernatremia, hyperchloremia, acute kidney injury and altered mental status. CT head negative. Nephrology consulted for assistance with evaluation and management of acute kidney injury and electrolyte abnormalities. AKI & hypernatremia improving. ID & psychiatry consulted. Mental status has improved. Creatinine and hypernatremia have improved. Transfer to medical floor 3/24. Possible DC home in the next 48 hours.   Assessment & Plan:   Acute encephalopathy - May be multifactorial related to electrolyte abnormalities (hyperglycemia, hypernatremia), acute kidney injury complicating underlying depression. - CT head without acute findings. Ammonia: Normal. UDS negative. Urine microscopy-not indicative of UTI. Chest x-ray without acute findings. TSH normal. - Due to intermittent somnolence, temporarily discontinued all psychiatric medications except when necessary Ativan and Haldol. - Treat underlying cause and monitor closely. - Mental status has improved. Alert, some oriented and answers some questions appropriately.  Poorly controlled DM 2  with hyperosmolar state and DKA - A1c 06/2015:6.5. No diabetic medications listed on home meds PTA. Glucose on admission >700 and AG 23. - Admitted to stepdown unit and treated per DKA protocol with aggressive IV fluids and insulin drip. DKA had improved but anion gap metabolic acidosis recurred on 8/113/21 and IV insulin drip and IV fluids were reinitiated. - Hemoglobin A1c 3/20:11.7. - Transitioned to Lantus and SSI on 3/22 PM.  - CBGs in the 200s. D5 IV infusion probably contributing and hence discontinued this morning. Monitor on current doses of Lantus 20 units at bedtime and moderate NovoLog SSI. Reasonable control on this until D5 infusion restarted 3/25. May need further adjustment of insulins.  Hypernatremia, hyperchloremia - Unclear etiology. Only came PTA but unsure as to when he last took his medications. Difficulty monitoring strict intake and output-patient keeps pulling out condom catheter. As per nursing, no large volume urine output and hence suspect no polyuria. Patient does not complain of frequent thirst either. - He might have had hyperchloremic metabolic acidosis 3/21 as a cause of his elevated anion gap. - Nephrology consultation appreciated. Discussed with Dr. 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  - Patient was treated aggressively with IV normal saline boluses/maintenance and IV D5W for almost 48 hours. Hypernatremia has resolved. Discussed with nephrology 3/24-may discontinue D5W, continue normal saline for additional 24 hours at reduced rate until oral intake consistently increases. - On 3/25, hypernatremic again at 149. DC NS and start D5 IV infusion. Encouraged increased free water intake by mouth. Nurses advised to keep jar of water next to patient.  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 Renal ultrasound without hydronephrosis. Some degree of  rhabdomyolysis may be contribute in. - Nephrology consultation appreciated. Improving. Baseline creatinine may be in the 1.4-1.5 range.  Rhabdomyolysis - CK 4496 on 3/21. No history of fall or trauma noted. Treated with IV fluid. CK down to 246. Improved.  HIV - Continue HAART - 07/01/15: CD4: 542 and HIV RNA nondetectable - HIV RNA quantitative <20 on 11/10/15 - Some concern regarding patient's HIV medications contributing to acute renal failure, ID consulted and recommended continuing descovy and changed isn to dtgv  Hypothyroid - Change Synthroid to oral now that patient is tolerating diet.  Depression/anxiety - Due to somnolence, temporarily hold BuSpar, Cymbalta, mirtazapine and use when necessary IV lorazepam for anxiety and Haldol for agitation.  - Lithium level on 3/20:0.17. - Discussed with psychiatry and their follow-up 3/25 noted. Starting low-dose Cymbalta and Klonopin. No lithium or Depakote at this time. DC when necessary Haldol.  Mildly abnormal LFTs - Likely from rhabdomyolysis. Improving. - HCV antibody: 0.1.  Hypokalemia - Replace and follow  Hypophosphatemia - Replaced  Thrombocytopenia - Unclear etiology.  - Platelets had dropped to a low of 52 but are starting to improve. Follow CBCs.  Diffuse hepatic steatosis/hepatocellular disease & 1.6 cm indeterminate hypoechoic lesion in right hepatic lobe, seen on ultrasound - Need further workup by MRI in the future.  Skin rash - Unclear etiology. No new medications started apart from insulins. Nonpruritic. No specific dermatomal localization. Probably noninfectious. Monitor closely. Skin rash on anterior trunk has significantly improved. Persisting on back-try hydrocortisone topical. - Discussed with infectious disease M.D. and requested review of rash on 3/26.  Mechanical fall - As per RN, on 3/24 AM, patient attempted to get out of bed and  fell next to his bed without sustaining any injuries. Recruitment consultant. PT evaluation >recommends home health PT and 24 hour supervision and if not available, recommend SNF.  DVT prophylaxis: SCDs Code Status: Full Family Communication: None at bedside.  Disposition Plan: Admitted initially to stepdown bed. Transferred to medical bed 11/14/15. DC home vs SNF in 48 hours.   Consultants:  Nephrology  Psychiatry  Infectious Disease  Procedures:  Foley catheter 3/22 >3/24  Antimicrobials:  Raltegravir 3/22>  Dolutegravir 3/23>  Emtricitabine 3/23>  Subjective: Denies complaints. Denies itching. As per RN, no agitation reported.  Objective: Filed Vitals:   11/14/15 2116 11/15/15 0534 11/15/15 1134 11/15/15 1337  BP: 136/75 103/57 131/67 119/62  Pulse: 95 82 83 79  Temp: 97.9 F (36.6 C)   98 F (36.7 C)  TempSrc: Oral Oral  Oral  Resp: Height:      Weight:      SpO2: 98% 100%  100%    Intake/Output Summary (Last 24 hours) at 11/15/15 1543 Last data filed at 11/15/15 1225  Gross per 24 hour  Intake    720 ml  Output    950 ml  Net   -230 ml   Filed Weights   11/11/15 0500 11/13/15 0406 11/14/15 0312  Weight: 74.4 kg (164 lb 0.4 oz) 80.1 kg (176 lb 9.4 oz) 82.1 kg (181 lb)    Exam:  General exam: Moderately built and nourished pleasant middle-aged male lying comfortably in bed. Respiratory system: clear to auscultation. No increased work of breathing. Cardiovascular system: S1 & S2 heard, RRR. No JVD, murmurs, gallops, clicks or pedal edema. . Gastrointestinal system: Abdomen is nondistended, soft and nontender. Normal bowel sounds heard. Central nervous system: Alert, oriented to self, partly to place and time, follows instructions appropriately.  No focal neurological deficits. No neck stiffness. Extremities: Symmetric 5 x 5 power-moving all limbs actively. Skin: Diffuse, pinhead-sized, hyperpigmented ? Petechial lesions seen on anterior posterior  trunk and? Left side of forehead/face and none on extremities. Rash on anterior trunk is significantly improved. Persisting on back.    Data Reviewed: Basic Metabolic Panel:  Recent Labs Lab 11/12/15 1718 11/13/15 0302 11/13/15 1816 11/14/15 0309 11/15/15 0524  NA 158* 149* 145 143 149*  K 3.7 3.8 3.4* 3.5 3.4*  CL 125* 122* 117* 115* 117*  CO2 23 18* 20* 20* 21*  GLUCOSE 165* 280* 222* 243* 145*  BUN 54* 39* 27* 21* 16  CREATININE 2.29* 1.80* 1.58* 1.56* 1.41*  CALCIUM 7.5* 7.1* 7.2* 7.5* 8.1*  PHOS  --   --   --  2.0* 2.7   Liver Function Tests:  Recent Labs Lab 11/10/15 1104 11/12/15 0227 11/15/15 0524  AST 60* 96* 74*  ALT 111* 83* 74*  ALKPHOS 140* 93 97  BILITOT 1.8* 0.9 1.4*  PROT 8.7* 6.7 6.5  ALBUMIN 4.7 3.5 2.8*    Recent Labs Lab 11/10/15 1104  LIPASE 62*    Recent Labs Lab 11/12/15 0225  AMMONIA 21   CBC:  Recent Labs Lab 11/10/15 1104  11/11/15 0307 11/12/15 0227 11/13/15 0302 11/14/15 0309 11/15/15 0524  WBC 13.7*  --  21.3* 14.9* 8.4 6.0 6.5  NEUTROABS 12.1*  --   --   --   --  4.7  --   HGB 20.0*  < > 18.9* 16.2 13.8 12.9* 13.3  HCT 59.9*  < > 57.2* 52.4* 42.2 39.3 41.5  MCV 98.8  --  99.1 102.7* 97.7 98.0 96.7  PLT 171  --  168 112* 54* 52* 62*  < > = values in this interval not displayed. Cardiac Enzymes:  Recent Labs Lab 11/11/15 1400 11/12/15 0707 11/14/15 0309 11/15/15 0524  CKTOTAL 4496* 3026* 608* 246   BNP (last 3 results) No results for input(s): PROBNP in the last 8760 hours. CBG:  Recent Labs Lab 11/14/15 1146 11/14/15 1652 11/14/15 2305 11/15/15 0745 11/15/15 1136  GLUCAP 216* 136* 159* 118* 265*    Recent Results (from the past 240 hour(s))  MRSA PCR Screening     Status: None   Collection Time: 11/10/15  5:25 PM  Result Value Ref Range Status   MRSA by PCR NEGATIVE NEGATIVE Final    Comment:        The GeneXpert MRSA Assay (FDA approved for NASAL specimens only), is one component of  a comprehensive MRSA colonization surveillance program. It is not intended to diagnose MRSA infection nor to guide or monitor treatment for MRSA infections.          Studies: No results found.      Scheduled Meds: . clonazePAM  0.5 mg Oral BID  . dolutegravir  50 mg Oral Daily  . DULoxetine  30 mg Oral Daily  . emtricitabine-tenofovir AF  1 tablet Oral Daily  . insulin aspart  0-15 Units Subcutaneous TID WC  . insulin aspart  0-5 Units Subcutaneous QHS  . insulin glargine  20 Units Subcutaneous QHS  . levothyroxine  50 mcg Oral QAC breakfast  . phosphorus  250 mg Oral TID  . sodium chloride flush  3 mL Intravenous Q12H   Continuous Infusions: . dextrose 100 mL/hr at 11/15/15 1610    Principal Problem:   Bipolar I disorder, most recent episode depressed (HCC) Active Problems:   HIV DISEASE  History of hepatitis B   Acute encephalopathy   Uncontrolled type 2 DM with hyperosmolar nonketotic hyperglycemia (HCC)   High anion gap metabolic acidosis   Depression   AKI (acute kidney injury) (HCC)   Hyperosmolar (nonketotic) coma (HCC)   Protein-calorie malnutrition, severe    Time spent: 30 minutes.    Marcellus Scott, MD, FACP, FHM. [facility] Pager [phone]  If 7PM-7AM, please contact night-coverage [email or web address] Password [facility] 11/15/2015, 3:43 PM    LOS: 5 days

## 2015-11-15 NOTE — Evaluation (Addendum)
Physical Therapy Evaluation Patient Details Name: Gilberto Stanforth MRN: [id number] DOB: Mar 20, 1951 Today's Date: 11/15/2015   History of Present Illness  65 yo male admitted with depression, DKA, encephalopathy, rhabdomyolysis, AKI. Hx of severe depression, bipolar d/o, DM, HTN  Clinical Impression  On eval, pt required Min assist for mobility-walked ~300 feet with RW. Pt is very unsteady and remains at high risk for falls (per RN has fallen x2 during hospital stay). Pt demonstrates decreased safety awareness and delayed balance reactions. Recommend HHPT and 24 hour care at this time. May need ST rehab stay if 24 hour care cannot be arranged. Recommend daily ambulation in hallway with nursing assist.     Follow Up Recommendations Home health PT;Supervision/Assistance - 24 hour (if 24 hour care not available, will need SNF)    Equipment Recommendations  Rolling walker with 5" wheels    Recommendations for Other Services       Precautions / Restrictions Precautions Precautions: Fall Precaution Comments: high fall risk Restrictions Weight Bearing Restrictions: No      Mobility  Bed Mobility Overal bed mobility: Needs Assistance Bed Mobility: Supine to Sit     Supine to sit: Min assist     General bed mobility comments: assist for trunk to upright. HHA.   Transfers Overall transfer level: Needs assistance Equipment used: Rolling walker (2 wheeled) Transfers: Sit to/from Stand Sit to Stand: Min assist         General transfer comment: Assist to rise, stabilize, control descent. VCs safety, hand placement.   Ambulation/Gait Ambulation/Gait assistance: Min assist Ambulation Distance (Feet): 300 Feet Assistive device: Rolling walker (2 wheeled) Gait Pattern/deviations: Step-through pattern;Decreased stride length;Staggering left;Staggering right;Drifts right/left     General Gait Details: Assist to stabilize pt and maneuver safely with RW. Pt repeatedly steered into  objects in environment. LOB x 3 even with RW use.   Stairs            Wheelchair Mobility    Modified Rankin (Stroke Patients Only)       Balance Overall balance assessment: Needs assistance;History of Falls Sitting-balance support: Feet supported Sitting balance-Leahy Scale: Good     Standing balance support: Bilateral upper extremity supported;During functional activity Standing balance-Leahy Scale: Poor                               Pertinent Vitals/Pain Pain Assessment: Faces Faces Pain Scale: Hurts even more Pain Location: back Pain Descriptors / Indicators: Sore Pain Intervention(s): Monitored during session;Repositioned    Home Living Family/patient expects to be discharged to:: Private residence Living Arrangements: Spouse/significant other Available Help at Discharge: Family Type of Home: House Home Access: Stairs to enter Entrance Stairs-Rails: Right Entrance Stairs-Number of Steps: 3 Home Layout: One level Home Equipment: None      Prior Function Level of Independence: Independent               Hand Dominance        Extremity/Trunk Assessment   Upper Extremity Assessment: Generalized weakness           Lower Extremity Assessment: Generalized weakness      Cervical / Trunk Assessment: Normal  Communication   Communication: No difficulties  Cognition Arousal/Alertness: Awake/alert Behavior During Therapy: WFL for tasks assessed/performed Overall Cognitive Status: Impaired/Different from baseline Area of Impairment: Safety/judgement;Problem solving         Safety/Judgement: Decreased awareness of safety;Decreased awareness of deficits   Problem Solving: Requires  tactile cues;Requires verbal cues      General Comments      Exercises        Assessment/Plan    PT Assessment Patient needs continued PT services  PT Diagnosis Difficulty walking;Generalized weakness;Altered mental status   PT Problem  List Decreased mobility;Decreased balance;Decreased coordination;Decreased safety awareness;Decreased cognition;Decreased knowledge of use of DME  PT Treatment Interventions DME instruction;Gait training;Functional mobility training;Therapeutic activities;Patient/family education;Therapeutic exercise;Balance training   PT Goals (Current goals can be found in the Care Plan section) Acute Rehab PT Goals Patient Stated Goal: to be able to get up and move around PT Goal Formulation: With patient Time For Goal Achievement: 11/29/15 Potential to Achieve Goals: Good    Frequency Min 3X/week   Barriers to discharge        Co-evaluation               End of Session Equipment Utilized During Treatment: Gait belt Activity Tolerance: Patient tolerated treatment well Patient left: in chair;with call bell/phone within reach;with chair alarm set;with restraints reapplied           Time: 0931-1004 PT Time Calculation (min) (ACUTE ONLY): 33 min   Charges:   PT Evaluation $PT Eval Low Complexity: 1 Procedure PT Treatments $Gait Training: 8-22 mins   PT G Codes:        Rebeca AlertJannie Jacquel Mccamish, MPT Pager: [phone]

## 2015-11-15 NOTE — Consult Note (Signed)
[facility] Face-to-Face Psychiatry Consult   Reason for Consult:  Medication management and history of bipolar depression and AMS Referring Physician:  Dr. Algis Liming Patient Identification: Micheal Morales MRN:  [id number] Principal Diagnosis: Bipolar I disorder, most recent episode depressed [facility] Diagnosis:   Patient Active Problem List   Diagnosis Date Noted  . Bipolar I disorder, most recent episode depressed ([place]) [F31.30] 11/13/2015  . Protein-calorie malnutrition, severe [E43] 11/11/2015  . Hyperosmolar (nonketotic) coma ([place]) [E11.01]   . Acute encephalopathy [G93.40] 11/10/2015  . Uncontrolled type 2 DM with hyperosmolar nonketotic hyperglycemia ([place]) [E11.00] 11/10/2015  . High anion gap metabolic acidosis [W09.8] 11/10/2015  . Depression [F32.9] 11/10/2015  . AKI (acute kidney injury) ([place]) [N17.9] 11/10/2015  . HIV DISEASE [B20] 10/08/2006  . History of hepatitis B [Z86.19] 10/08/2006    Total Time spent with patient: 1 hour  Subjective:   Micheal Morales is a 65 y.o. male patient admitted with AMS and hyperglycemia.  HPI:  Micheal Morales is a 65 years old male admitted to [facility] with altered mental status and hyperglycemia/diabetic ketoacidosis. Patient seen for face-to-face psychiatric consultation and evaluation of current mental status and medication management for depression and anxiety. Patient appeared lying on his bed and easily woken up and able to communicate with this provider without significant distress, difficult to comprehend because of slightly slurred speech. Patient daughter who was at bedside unable to contribute part of the psychiatric history. Reportedly patient has been depressed over 4 years since he lost his job in [employer]. Patient has been receiving psychiatric medication management initially from the Dr. Doyle Askew at [facility] psychiatry and for the last 2 years seeing a psychiatric nurse practitioner [facility]. It is not clear patient has been compliant with his medication or not and also not clear what medication was introduced lately. Patient daughter reported his medication Remeron and lithium seems to be new during the 2017. Patient is also likes to eat sweets and does not share his medical history with his wife or daughter. Past Psychiatric History: Patient has no history of inpatient psychiatric hospitalization.   Interval history: Patient seen for psych consultation follow up and case discussed with Dr. Algis Liming. Patient appeared lying on his bed, calm and cooperative. He stated that he is feeling much better and has worked with physical therapist and able to walk in hall way. He seems to be improved from altered mental status and also no more slurred speech. He has endorsed that he has stopped eating regular meals about three weeks and supplemented with fruits and fruit juices prior to admitted to hospital and lost about 25 pounds. Patient stated that he is not able to see his psych provider but able to get refills. He was never told that he has bipolar disorder and it is not clear why he needs two different mood stabilizers. Not able to obtain his medical records from out patient from [place] at this time. He is willing to restart his depression and anxiety medication. He has no SI/HI or psychosis. No reported agitation or mania.   Risk to Self: Is patient at risk for suicide?: No Risk to Others:   Prior Inpatient Therapy:   Prior Outpatient Therapy:    Past Medical History:  Past Medical History  Diagnosis Date  . HIV infection ([place])   . Hypertension   . Depression   . AKI (acute kidney injury) ([place]) 11/10/2015   History reviewed. No pertinent past surgical history.  Family History: No family history on file. Family Psychiatric  History: Significant for depression in his brother and father.  Social History:  History  Alcohol Use: Not on file     History  Drug Use Not on  file    Social History   Social History  . Marital Status: Married    Spouse Name: N/A  . Number of Children: N/A  . Years of Education: N/A   Social History Main Topics  . Smoking status: Never Smoker   . Smokeless tobacco: None  . Alcohol Use: None  . Drug Use: None  . Sexual Activity: Not Asked   Other Topics Concern  . None   Social History Narrative   Additional Social History:    Allergies:   Allergies  Allergen Reactions  . Cefaclor     REACTION: rash    Labs:  Results for orders placed or performed during the hospital encounter of 11/10/15 (from the past 48 hour(s))  Glucose, capillary     Status: Abnormal   Collection Time: 11/13/15  4:03 PM  Result Value Ref Range   Glucose-Capillary 222 (H) 65 - 99 mg/dL  Basic metabolic panel     Status: Abnormal   Collection Time: 11/13/15  6:16 PM  Result Value Ref Range   Sodium 145 135 - 145 mmol/L   Potassium 3.4 (L) 3.5 - 5.1 mmol/L   Chloride 117 (H) 101 - 111 mmol/L   CO2 20 (L) 22 - 32 mmol/L   Glucose, Bld 222 (H) 65 - 99 mg/dL   BUN 27 (H) 6 - 20 mg/dL   Creatinine, Ser 6.96 (H) 0.61 - 1.24 mg/dL   Calcium 7.2 (L) 8.9 - 10.3 mg/dL   GFR calc non Af Amer 45 (L) >60 mL/min   GFR calc Af Amer 52 (L) >60 mL/min    Comment: (NOTE) The eGFR has been calculated using the CKD EPI equation. This calculation has not been validated in all clinical situations. eGFR's persistently <60 mL/min signify possible Chronic Kidney Disease.    Anion gap 8 5 - 15  Glucose, capillary     Status: Abnormal   Collection Time: 11/13/15  9:08 PM  Result Value Ref Range   Glucose-Capillary 215 (H) 65 - 99 mg/dL   Comment 1 Notify RN    Comment 2 Document in Chart   Basic metabolic panel     Status: Abnormal   Collection Time: 11/14/15  3:09 AM  Result Value Ref Range   Sodium 143 135 - 145 mmol/L   Potassium 3.5 3.5 - 5.1 mmol/L   Chloride 115 (H) 101 - 111 mmol/L   CO2 20 (L) 22 - 32 mmol/L   Glucose, Bld 243 (H) 65 -  99 mg/dL   BUN 21 (H) 6 - 20 mg/dL   Creatinine, Ser 2.95 (H) 0.61 - 1.24 mg/dL   Calcium 7.5 (L) 8.9 - 10.3 mg/dL   GFR calc non Af Amer 46 (L) >60 mL/min   GFR calc Af Amer 53 (L) >60 mL/min    Comment: (NOTE) The eGFR has been calculated using the CKD EPI equation. This calculation has not been validated in all clinical situations. eGFR's persistently <60 mL/min signify possible Chronic Kidney Disease.    Anion gap 8 5 - 15  CBC with Differential/Platelet     Status: Abnormal   Collection Time: 11/14/15  3:09 AM  Result Value Ref Range   WBC 6.0 4.0 - 10.5 K/uL   RBC 4.01 (L)  4.22 - 5.81 MIL/uL   Hemoglobin 12.9 (L) 13.0 - 17.0 g/dL   HCT 39.3 39.0 - 52.0 %   MCV 98.0 78.0 - 100.0 fL   MCH 32.2 26.0 - 34.0 pg   MCHC 32.8 30.0 - 36.0 g/dL   RDW 12.7 11.5 - 15.5 %   Platelets 52 (L) 150 - 400 K/uL    Comment: CONSISTENT WITH PREVIOUS RESULT   Neutrophils Relative % 77 %   Neutro Abs 4.7 1.7 - 7.7 K/uL   Lymphocytes Relative 14 %   Lymphs Abs 0.8 0.7 - 4.0 K/uL   Monocytes Relative 7 %   Monocytes Absolute 0.4 0.1 - 1.0 K/uL   Eosinophils Relative 2 %   Eosinophils Absolute 0.1 0.0 - 0.7 K/uL   Basophils Relative 0 %   Basophils Absolute 0.0 0.0 - 0.1 K/uL  CK     Status: Abnormal   Collection Time: 11/14/15  3:09 AM  Result Value Ref Range   Total CK 608 (H) 49 - 397 U/L  Protime-INR     Status: Abnormal   Collection Time: 11/14/15  3:09 AM  Result Value Ref Range   Prothrombin Time 19.2 (H) 11.6 - 15.2 seconds   INR 1.68 (H) 0.00 - 1.49  Phosphorus     Status: Abnormal   Collection Time: 11/14/15  3:09 AM  Result Value Ref Range   Phosphorus 2.0 (L) 2.5 - 4.6 mg/dL  Glucose, capillary     Status: Abnormal   Collection Time: 11/14/15  8:25 AM  Result Value Ref Range   Glucose-Capillary 218 (H) 65 - 99 mg/dL  Glucose, capillary     Status: Abnormal   Collection Time: 11/14/15 11:46 AM  Result Value Ref Range   Glucose-Capillary 216 (H) 65 - 99 mg/dL   Comment  1 Notify RN    Comment 2 Document in Chart   Glucose, capillary     Status: Abnormal   Collection Time: 11/14/15  4:52 PM  Result Value Ref Range   Glucose-Capillary 136 (H) 65 - 99 mg/dL  Glucose, capillary     Status: Abnormal   Collection Time: 11/14/15 11:05 PM  Result Value Ref Range   Glucose-Capillary 159 (H) 65 - 99 mg/dL  CBC     Status: Abnormal   Collection Time: 11/15/15  5:24 AM  Result Value Ref Range   WBC 6.5 4.0 - 10.5 K/uL   RBC 4.29 4.22 - 5.81 MIL/uL   Hemoglobin 13.3 13.0 - 17.0 g/dL   HCT 41.5 39.0 - 52.0 %   MCV 96.7 78.0 - 100.0 fL   MCH 31.0 26.0 - 34.0 pg   MCHC 32.0 30.0 - 36.0 g/dL   RDW 12.6 11.5 - 15.5 %   Platelets 62 (L) 150 - 400 K/uL    Comment: CONSISTENT WITH PREVIOUS RESULT  Comprehensive metabolic panel     Status: Abnormal   Collection Time: 11/15/15  5:24 AM  Result Value Ref Range   Sodium 149 (H) 135 - 145 mmol/L   Potassium 3.4 (L) 3.5 - 5.1 mmol/L   Chloride 117 (H) 101 - 111 mmol/L   CO2 21 (L) 22 - 32 mmol/L   Glucose, Bld 145 (H) 65 - 99 mg/dL   BUN 16 6 - 20 mg/dL   Creatinine, Ser 1.41 (H) 0.61 - 1.24 mg/dL   Calcium 8.1 (L) 8.9 - 10.3 mg/dL   Total Protein 6.5 6.5 - 8.1 g/dL   Albumin 2.8 (L) 3.5 - 5.0 g/dL  AST 74 (H) 15 - 41 U/L   ALT 74 (H) 17 - 63 U/L   Alkaline Phosphatase 97 38 - 126 U/L   Total Bilirubin 1.4 (H) 0.3 - 1.2 mg/dL   GFR calc non Af Amer 52 (L) >60 mL/min   GFR calc Af Amer 60 (L) >60 mL/min    Comment: (NOTE) The eGFR has been calculated using the CKD EPI equation. This calculation has not been validated in all clinical situations. eGFR's persistently <60 mL/min signify possible Chronic Kidney Disease.    Anion gap 11 5 - 15  CK     Status: None   Collection Time: 11/15/15  5:24 AM  Result Value Ref Range   Total CK 246 49 - 397 U/L  Phosphorus     Status: None   Collection Time: 11/15/15  5:24 AM  Result Value Ref Range   Phosphorus 2.7 2.5 - 4.6 mg/dL  Glucose, capillary     Status:  Abnormal   Collection Time: 11/15/15  7:45 AM  Result Value Ref Range   Glucose-Capillary 118 (H) 65 - 99 mg/dL   Comment 1 Notify RN   Glucose, capillary     Status: Abnormal   Collection Time: 11/15/15 11:36 AM  Result Value Ref Range   Glucose-Capillary 265 (H) 65 - 99 mg/dL   Comment 1 Notify RN     Current Facility-Administered Medications  Medication Dose Route Frequency Provider Last Rate Last Dose  . dextrose 5 % solution   Intravenous Continuous Modena Jansky, MD 100 mL/hr at 11/15/15 0859    . dolutegravir (TIVICAY) tablet 50 mg  50 mg Oral Daily Campbell Riches, MD   50 mg at 11/15/15 0858  . emtricitabine-tenofovir AF (DESCOVY) 200-25 MG per tablet 1 tablet  1 tablet Oral Daily Campbell Riches, MD   1 tablet at 11/15/15 0858  . haloperidol lactate (HALDOL) injection 2 mg  2 mg Intravenous Q12H PRN Modena Jansky, MD   2 mg at 11/13/15 2201  . insulin aspart (novoLOG) injection 0-15 Units  0-15 Units Subcutaneous TID WC Modena Jansky, MD   8 Units at 11/15/15 1158  . insulin aspart (novoLOG) injection 0-5 Units  0-5 Units Subcutaneous QHS Modena Jansky, MD   2 Units at 11/13/15 2142  . insulin glargine (LANTUS) injection 20 Units  20 Units Subcutaneous QHS Modena Jansky, MD   20 Units at 11/14/15 2331  . levothyroxine (SYNTHROID, LEVOTHROID) tablet 50 mcg  50 mcg Oral QAC breakfast Modena Jansky, MD   50 mcg at 11/15/15 0858  . LORazepam (ATIVAN) injection 0.5 mg  0.5 mg Intravenous Q12H PRN Modena Jansky, MD   0.5 mg at 11/14/15 1058  . phosphorus (K PHOS NEUTRAL) tablet 250 mg  250 mg Oral TID Modena Jansky, MD   250 mg at 11/15/15 0858  . sodium chloride flush (NS) 0.9 % injection 3 mL  3 mL Intravenous Q12H Robbie Lis, MD   3 mL at 11/15/15 5885    Musculoskeletal: Strength & Muscle Tone: decreased Gait & Station: unable to stand Patient leans: N/A  Psychiatric Specialty Exam: ROS depression, anxiety, confusion, and poor concentration   Blood pressure 131/67, pulse 83, temperature 97.9 F (36.6 C), temperature source Oral, resp. rate 20, height '5\' 5"'$  (1.651 m), weight 82.1 kg (181 lb), SpO2 100 %.Body mass index is 30.12 kg/(m^2).  General Appearance: Casual  Eye Contact::  Fair  Speech:  Clear and Coherent  Volume:  Decreased  Mood:  Anxious and Depressed  Affect:  Congruent and Depressed  Thought Process:  Coherent and Linear  Orientation:  Full (Time, Place, and Person)  Thought Content:  WDL  Suicidal Thoughts:  No  Homicidal Thoughts:  No  Memory:  Immediate;   Good Recent;   Fair  Judgement:  Intact  Insight:  Fair  Psychomotor Activity:  Decreased  Concentration:  Fair  Recall:  [employer] of Knowledge:Good  Language: Good  Akathisia:  Negative  Handed:  Right  AIMS (if indicated):     Assets:  Communication Skills Desire for Improvement Financial Resources/Insurance Housing Intimacy Leisure Time Resilience Social Support Transportation  ADL's:  Impaired  Cognition: Impaired,  Moderate  Sleep:      Treatment Plan Summary: Daily contact with patient to assess and evaluate symptoms and progress in treatment and Medication management   Will start his medication for depression and anxiety with low dosages as he is off of his medication few days Patient agree to start Cymbalta and klonopin  Will not start Lithium and depakote as he has multiple side effects and no clear diagnosis of bipolar depression or agitation  Patient has improved cognition and speech and monitor for multiple abnormal electrolytes and blood sugar   Pending medical records/recent medication changes from [facility]   Patient out patient psych provider is Comer Locket, NP from [facility].   Patient family is supportive to him.  Disposition: Patient does not meet criteria for psychiatric inpatient admission. Supportive therapy provided about ongoing stressors.  Durward Parcel., MD 11/15/2015  12:30 PM

## 2015-11-15 NOTE — Progress Notes (Signed)
Attempted to show patient the Diabetic videos but patient fell asleep.  Will continue to monitor.

## 2015-11-16 DIAGNOSIS — R21 Rash and other nonspecific skin eruption: Secondary | ICD-10-CM | POA: Insufficient documentation

## 2015-11-16 LAB — CBC
HEMATOCRIT: 40.1 % (ref 39.0–52.0)
Hemoglobin: 13.7 g/dL (ref 13.0–17.0)
MCH: 32.4 pg (ref 26.0–34.0)
MCHC: 34.2 g/dL (ref 30.0–36.0)
MCV: 94.8 fL (ref 78.0–100.0)
Platelets: 77 10*3/uL — ABNORMAL LOW (ref 150–400)
RBC: 4.23 MIL/uL (ref 4.22–5.81)
RDW: 12.4 % (ref 11.5–15.5)
WBC: 6.7 10*3/uL (ref 4.0–10.5)

## 2015-11-16 LAB — BASIC METABOLIC PANEL
ANION GAP: 11 (ref 5–15)
BUN: 14 mg/dL (ref 6–20)
CHLORIDE: 110 mmol/L (ref 101–111)
CO2: 19 mmol/L — ABNORMAL LOW (ref 22–32)
Calcium: 8.1 mg/dL — ABNORMAL LOW (ref 8.9–10.3)
Creatinine, Ser: 1.32 mg/dL — ABNORMAL HIGH (ref 0.61–1.24)
GFR, EST NON AFRICAN AMERICAN: 56 mL/min — AB (ref >60)
Glucose, Bld: 234 mg/dL — ABNORMAL HIGH (ref 65–99)
POTASSIUM: 3.4 mmol/L — AB (ref 3.5–5.1)
SODIUM: 140 mmol/L (ref 135–145)

## 2015-11-16 LAB — GLUCOSE, CAPILLARY
GLUCOSE-CAPILLARY: 204 mg/dL — AB (ref 65–99)
Glucose-Capillary: 310 mg/dL — ABNORMAL HIGH (ref 65–99)
Glucose-Capillary: 176 mg/dL — ABNORMAL HIGH (ref 65–99)
Glucose-Capillary: 171 mg/dL — ABNORMAL HIGH (ref 65–99)

## 2015-11-16 LAB — MAGNESIUM: Magnesium: 2.1 mg/dL (ref 1.7–2.4)

## 2015-11-16 MED ORDER — POTASSIUM CHLORIDE CRYS ER 10 MEQ PO TBCR
30.0000 meq | EXTENDED_RELEASE_TABLET | ORAL | Status: AC
  Administered 2015-11-16 (×2): 30 meq via ORAL
  Filled 2015-11-16 (×2): qty 1

## 2015-11-16 MED ORDER — DEXTROSE 5 % IV SOLN
INTRAVENOUS | Status: DC
  Administered 2015-11-16: 11:00:00 via INTRAVENOUS

## 2015-11-16 MED ORDER — INSULIN GLARGINE 100 UNIT/ML ~~LOC~~ SOLN
25.0000 [IU] | Freq: Every day | SUBCUTANEOUS | Status: DC
  Administered 2015-11-16: 25 [IU] via SUBCUTANEOUS
  Filled 2015-11-16 (×2): qty 0.25

## 2015-11-16 NOTE — Progress Notes (Signed)
PROGRESS NOTE    Micheal Morales  ZOX:[id number]  DOB: 09/24/1950  DOA: 11/10/2015 PCP: Forrest Moron, MD Outpatient Specialists: Infectious disease: Dr. Paulette [place]anch [place] Psychiatry: Anne Fu, NP at [facility].   Hospital course: 65 year old male with history of HIV on HAART, HTN, depression, hypothyroid, presented to ED on 11/10/15 with history of depression over past couple of weeks PTA and could not see his psychiatrist and some question regarding compliance with medications. Patient was drowsy and unable to provide history which was obtained from his spouse. He only ate sweets, drank sweet juices and kept staring at the TV. Workup in the ED showed glucose of 1179, sodium 146, potassium 5.6, bicarbonate 19 and creatinine 2.5. Anion gap 23. He was admitted to stepdown unit for management of HONK/DKA. Since then, patient has persisting hypernatremia, hyperchloremia, acute kidney injury and altered mental status. CT head negative. Nephrology consulted for assistance with evaluation and management of acute kidney injury and electrolyte abnormalities. AKI & hypernatremia improving. ID & psychiatry consulted. Mental status has improved. Creatinine and hypernatremia have improved. Transfer to medical floor 3/24. Possible DC home 3/27   Assessment & Plan:   Acute encephalopathy - May be multifactorial related to electrolyte abnormalities (hyperglycemia, hypernatremia), acute kidney injury complicating underlying depression. - CT head without acute findings. Ammonia: Normal. UDS negative. Urine microscopy-not indicative of UTI. Chest x-ray without acute findings. TSH normal. - Due to intermittent somnolence, temporarily discontinued all psychiatric medications except when necessary Ativan and Haldol. - Treat underlying cause and monitor closely. - Probably resolved and close to baseline.  Poorly controlled DM 2 with hyperosmolar state and DKA - A1c 06/2015:6.5. No diabetic  medications listed on home meds PTA. Glucose on admission >700 and AG 23. - Admitted to stepdown unit and treated per DKA protocol with aggressive IV fluids and insulin drip. DKA had improved but anion gap metabolic acidosis recurred on 8/11 and IV insulin drip and IV fluids were reinitiated. - Hemoglobin A1c 3/20:11.7. - Transitioned to Lantus and SSI on 3/22 PM.  - CBGs in the 200s. D5 IV infusion probably contributing and hence discontinued this morning. Monitor on current doses of Lantus 20 units at bedtime and moderate NovoLog SSI. Reasonable control on this until D5 infusion restarted 3/25. May need further adjustment of insulins. - Discussed with nursing to have spouse educated regarding insulin management.  Hypernatremia, hyperchloremia - Unclear etiology. Only came PTA but unsure as to when he last took his medications. Difficulty monitoring strict intake and output-patient keeps pulling out condom catheter. As per nursing, no large volume urine output and hence suspect no polyuria. Patient does not complain of frequent thirst either. - He might have had hyperchloremic metabolic acidosis 3/21 as a cause of his elevated anion gap. - Nephrology consultation appreciated. Discussed with Dr. 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  - Patient was treated aggressively with IV normal saline boluses/maintenance and IV D5W for almost 48 hours. Hypernatremia has resolved. Discussed with nephrology 3/24-may discontinue D5W, continue normal saline for additional 24 hours at reduced rate until oral intake consistently increases. - On 3/25, hypernatremic again at 149. DC NS and start D5 IV infusion. Encouraged increased free water intake by mouth. Nurses advised to keep jar of water next to patient. - Hypernatremia resolved. Reduce D5W infusion.  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  Renal ultrasound without hydronephrosis. Some degree of rhabdomyolysis may be contribute in. - Nephrology consultation appreciated. Improving. Creatinine now down to 1.3.  Rhabdomyolysis - CK 4496 on 3/21. No history of fall or trauma noted. Treated with IV fluid. CK down to 246. Improved.  HIV - Continue HAART - 07/01/15: CD4: 542 and HIV RNA nondetectable - HIV RNA quantitative <20 on 11/10/15 - Some concern regarding patient's HIV medications contributing to acute renal failure, ID consulted and recommended continuing descovy and changed isn to dtgv. Outpatient follow-up with ID.  Hypothyroid - Change Synthroid to oral now that patient is tolerating diet.  Depression/anxiety - Due to somnolence, temporarily hold BuSpar, Cymbalta, mirtazapine and use when necessary IV lorazepam for anxiety and Haldol for agitation.  - Lithium level on 3/20:0.17. - Discussed with psychiatry and their follow-up 3/25 noted. Started low-dose Cymbalta and Klonopin. No lithium or Depakote at this time. DC'd when necessary Haldol.  Mildly abnormal LFTs - Likely from rhabdomyolysis. Improving. - HCV antibody: 0.1.  Hypokalemia - Replace and follow  Hypophosphatemia - Replaced  Thrombocytopenia - Unclear etiology.  - Platelets had dropped to a low of 52 but are starting to improve. Follow CBCs.  Diffuse hepatic steatosis/hepatocellular disease & 1.6 cm indeterminate hypoechoic lesion in right hepatic lobe, seen on ultrasound - Need further workup by MRI in the future.  Skin rash - Unclear etiology. No new medications started apart from insulins. Nonpruritic. No specific dermatomal localization. Probably noninfectious. Monitor closely. Skin rash on anterior trunk has significantly improved. Persisting on back-try hydrocortisone topical. - Improving. ID follow-up appreciated. No further evaluation or workup of  rash.  Mechanical fall - As per RN, on 3/24 AM, patient attempted to get out of bed and fell next to his bed without sustaining any injuries. Recruitment consultant. PT evaluation >recommends home health PT and 24 hour supervision and if not available, recommend SNF.  DVT prophylaxis: SCDs Code Status: Full Family Communication: None at bedside.  Disposition Plan: Admitted initially to stepdown bed. Transferred to medical bed 11/14/15. DC home vs SNF 3/27   Consultants:  Nephrology  Psychiatry  Infectious Disease  Procedures:  Foley catheter 3/22 >3/24  Antimicrobials:  Raltegravir 3/22>  Dolutegravir 3/23>  Emtricitabine 3/23>  Subjective: Denies complaints. Anxious to go home. As per nursing, oriented and appropriate without agitation.  Objective: Filed Vitals:   11/15/15 1134 11/15/15 1337 11/15/15 2021 11/16/15 0549  BP: 131/67 119/62 128/68 130/71  Pulse: 83 79 99 62  Temp:  98 F (36.7 C) 98.7 F (37.1 C) 98.5 F (36.9 C)  TempSrc:  Oral Oral Oral  Resp:  Height:      Weight:      SpO2:  100% 97% 100%    Intake/Output Summary (Last 24 hours) at 11/16/15 1353 Last data filed at 11/16/15 0550  Gross per 24 hour  Intake   1625 ml  Output   1350 ml  Net    275 ml   Filed Weights   11/11/15 0500 11/13/15 0406 11/14/15 0312  Weight: 74.4 kg (164 lb 0.4 oz) 80.1 kg (176 lb 9.4 oz) 82.1 kg (181 lb)    Exam:  General exam: Moderately built and nourished pleasant middle-aged male lying comfortably in bed. Respiratory system: clear to auscultation. No increased work of breathing. Cardiovascular system: S1 & S2 heard, RRR. No JVD, murmurs, gallops, clicks or pedal edema. . Gastrointestinal system: Abdomen is nondistended, soft and nontender. Normal bowel sounds heard. Central nervous system: Alert,  oriented to self, partly to place and time, follows instructions appropriately. No focal neurological deficits. No neck stiffness. Extremities: Symmetric 5 x  5 power-moving all limbs actively. Skin: Diffuse, resolving maculopapular rash on trunk.  Data Reviewed: Basic Metabolic Panel:  Recent Labs Lab 11/13/15 0302 11/13/15 1816 11/14/15 0309 11/15/15 0524 11/16/15 0500  NA 149* 145 143 149* 140  K 3.8 3.4* 3.5 3.4* 3.4*  CL 122* 117* 115* 117* 110  CO2 18* 20* 20* 21* 19*  GLUCOSE 280* 222* 243* 145* 234*  BUN 39* 27* 21* 16 14  CREATININE 1.80* 1.58* 1.56* 1.41* 1.32*  CALCIUM 7.1* 7.2* 7.5* 8.1* 8.1*  MG  --   --   --   --  2.1  PHOS  --   --  2.0* 2.7  --    Liver Function Tests:  Recent Labs Lab 11/10/15 1104 11/12/15 0227 11/15/15 0524  AST 60* 96* 74*  ALT 111* 83* 74*  ALKPHOS 140* 93 97  BILITOT 1.8* 0.9 1.4*  PROT 8.7* 6.7 6.5  ALBUMIN 4.7 3.5 2.8*    Recent Labs Lab 11/10/15 1104  LIPASE 62*    Recent Labs Lab 11/12/15 0225  AMMONIA 21   CBC:  Recent Labs Lab 11/10/15 1104  11/12/15 0227 11/13/15 0302 11/14/15 0309 11/15/15 0524 11/16/15 0500  WBC 13.7*  < > 14.9* 8.4 6.0 6.5 6.7  NEUTROABS 12.1*  --   --   --  4.7  --   --   HGB 20.0*  < > 16.2 13.8 12.9* 13.3 13.7  HCT 59.9*  < > 52.4* 42.2 39.3 41.5 40.1  MCV 98.8  < > 102.7* 97.7 98.0 96.7 94.8  PLT 171  < > 112* 54* 52* 62* 77*  < > = values in this interval not displayed. Cardiac Enzymes:  Recent Labs Lab 11/11/15 1400 11/12/15 0707 11/14/15 0309 11/15/15 0524  CKTOTAL 4496* 3026* 608* 246   BNP (last 3 results) No results for input(s): PROBNP in the last 8760 hours. CBG:  Recent Labs Lab 11/15/15 1136 11/15/15 1620 11/15/15 2019 11/16/15 0744 11/16/15 1237  GLUCAP 265* 198* 276* 204* 310*    Recent Results (from the past 240 hour(s))  MRSA PCR Screening     Status: None   Collection Time: 11/10/15  5:25 PM  Result Value Ref Range Status   MRSA by PCR NEGATIVE NEGATIVE Final    Comment:        The GeneXpert MRSA Assay (FDA approved for NASAL specimens only), is one component of a comprehensive MRSA  colonization surveillance program. It is not intended to diagnose MRSA infection nor to guide or monitor treatment for MRSA infections.          Studies: No results found.      Scheduled Meds: . clonazePAM  0.5 mg Oral BID  . dolutegravir  50 mg Oral Daily  . DULoxetine  30 mg Oral Daily  . emtricitabine-tenofovir AF  1 tablet Oral Daily  . free water  200 mL Oral Q4H  . insulin aspart  0-15 Units Subcutaneous TID WC  . insulin aspart  0-5 Units Subcutaneous QHS  . insulin glargine  20 Units Subcutaneous QHS  . levothyroxine  50 mcg Oral QAC breakfast  . sodium chloride flush  3 mL Intravenous Q12H   Continuous Infusions: . dextrose 50 mL/hr at 11/16/15 1042    Principal Problem:   Bipolar I disorder, most recent episode depressed (HCC) Active Problems:   HIV DISEASE  History of hepatitis B   Acute encephalopathy   Uncontrolled type 2 DM with hyperosmolar nonketotic hyperglycemia (HCC)   High anion gap metabolic acidosis   Depression   AKI (acute kidney injury) (HCC)   Hyperosmolar (nonketotic) coma (HCC)   Protein-calorie malnutrition, severe   Encephalopathy acute   Hypernatremia   Hypokalemia    Time spent: 30 minutes.    Marcellus Scott, MD, FACP, FHM. [facility] Pager [phone]  If 7PM-7AM, please contact night-coverage [email or web address] Password [email or web address] 11/16/2015, 1:53 PM    LOS: 6 days

## 2015-11-16 NOTE — Progress Notes (Signed)
Patient asleep or had visitors most of shift, when wife and daughter arrived pt went back to sleep, wife wants patient to be primary learner and responsible for DM education and home management because she states she works and will not always be able to assist him in managing his DM, will listen to discharge teaching before patient goes home

## 2015-11-16 NOTE — Progress Notes (Signed)
Patient ID: Micheal Morales, male   DOB: June 11, 1951, 65 y.o.   MRN: [id number]         [facility]    Date of Admission:  11/10/2015     Principal Problem:   Bipolar I disorder, most recent episode depressed (HCC) Active Problems:   HIV DISEASE   History of hepatitis B   Acute encephalopathy   Uncontrolled type 2 DM with hyperosmolar nonketotic hyperglycemia (HCC)   High anion gap metabolic acidosis   Depression   AKI (acute kidney injury) (HCC)   Hyperosmolar (nonketotic) coma (HCC)   Protein-calorie malnutrition, severe   Encephalopathy acute   Hypernatremia   Hypokalemia   . clonazePAM  0.5 mg Oral BID  . dolutegravir  50 mg Oral Daily  . DULoxetine  30 mg Oral Daily  . emtricitabine-tenofovir AF  1 tablet Oral Daily  . free water  200 mL Oral Q4H  . insulin aspart  0-15 Units Subcutaneous TID WC  . insulin aspart  0-5 Units Subcutaneous QHS  . insulin glargine  20 Units Subcutaneous QHS  . levothyroxine  50 mcg Oral QAC breakfast  . potassium chloride  30 mEq Oral Q4H  . sodium chloride flush  3 mL Intravenous Q12H    SUBJECTIVE: He states that his rash started about 3-4 days ago. He has never had a rash like this before. It is only mildly pruritic. He does not notice any new skin lesions today. He thinks the skin lesions are drying up. He is asking if he can go home.  Review of Systems: Review of Systems  Constitutional: Negative for fever, chills, weight loss, malaise/fatigue and diaphoresis.  HENT: Negative for sore throat.   Respiratory: Negative for cough, sputum production and shortness of breath.   Cardiovascular: Negative for chest pain.  Gastrointestinal: Negative for nausea, vomiting and diarrhea.  Musculoskeletal: Negative for myalgias and joint pain.  Skin: Positive for rash. Negative for itching.  Neurological: Negative for headaches.    Past Medical History  Diagnosis Date  . HIV infection (HCC)   . Hypertension   .  Depression   . AKI (acute kidney injury) (HCC) 11/10/2015    Social History  Substance Use Topics  . Smoking status: Never Smoker   . Smokeless tobacco: None  . Alcohol Use: None    No family history on file. Allergies  Allergen Reactions  . Cefaclor     REACTION: rash    OBJECTIVE: Filed Vitals:   11/15/15 1134 11/15/15 1337 11/15/15 2021 11/16/15 0549  BP: 131/67 119/62 128/68 130/71  Pulse: 83 79 99 62  Temp:  98 F (36.7 C) 98.7 F (37.1 C) 98.5 F (36.9 C)  TempSrc:  Oral Oral Oral  Resp:  18 18 20   Height:      Weight:      SpO2:  100% 97% 100%   Body mass index is 30.12 kg/(m^2).  Physical Exam  Constitutional:  He was sleeping quietly when I entered the room but aroused easily. He answers all questions appropriately.  HENT:  Mouth/Throat: No oropharyngeal exudate.  A few small scabs on his lips.  Eyes: Conjunctivae are normal.  Cardiovascular: Normal rate and regular rhythm.   No murmur heard. Pulmonary/Chest: Breath sounds normal.  Abdominal: Soft. There is no tenderness.  Musculoskeletal: Normal range of motion.  Neurological: He is alert.  Skin: Rash noted.  Diffuse red maculopapular rash most prominent on arms, upper chest and back, some with excoriations. One erythematous  patch on his medial right knee.  Psychiatric: Mood and affect normal.    Lab Results Lab Results  Component Value Date   WBC 6.7 11/16/2015   HGB 13.7 11/16/2015   HCT 40.1 11/16/2015   MCV 94.8 11/16/2015   PLT 77* 11/16/2015    Lab Results  Component Value Date   CREATININE 1.32* 11/16/2015   BUN 14 11/16/2015   NA 140 11/16/2015   K 3.4* 11/16/2015   CL 110 11/16/2015   CO2 19* 11/16/2015    Lab Results  Component Value Date   ALT 74* 11/15/2015   AST 74* 11/15/2015   ALKPHOS 97 11/15/2015   BILITOT 1.4* 11/15/2015    HIV 1 RNA QUANT (copies/mL)  Date Value  11/13/2015 <20  11/10/2015 <20  07/03/2013 <20   HIV-1 RNA VIRAL LOAD (no units)  Date Value    07/01/2015 <40  06/25/2014 71  01/22/2014 <40   CD4 (no units)  Date Value  07/01/2015 542  06/25/2014 542  12/10/2011 521   CD4 T CELL ABS (/uL)  Date Value  11/13/2015 600  11/10/2015 300*  07/03/2013 490    Microbiology: Recent Results (from the past 240 hour(s))  MRSA PCR Screening     Status: None   Collection Time: 11/10/15  5:25 PM  Result Value Ref Range Status   MRSA by PCR NEGATIVE NEGATIVE Final    Comment:        The GeneXpert MRSA Assay (FDA approved for NASAL specimens only), is one component of a comprehensive MRSA colonization surveillance program. It is not intended to diagnose MRSA infection nor to guide or monitor treatment for MRSA infections.      ASSESSMENT: I do not know the cause of his rash but it looks like it is starting to resolve. I do not See any lesions that appear to be new and the existing lesions look like they are drying up. As long as the rash is improving I would not pursue skin biopsy.  PLAN: 1. Continue observation of rash  Cliffton Asters, MD [facility] [phone] pager   [phone] cell 11/16/2015, 11:56 AM

## 2015-11-17 LAB — BASIC METABOLIC PANEL
ANION GAP: 11 (ref 5–15)
BUN: 11 mg/dL (ref 6–20)
CALCIUM: 8.4 mg/dL — AB (ref 8.9–10.3)
CHLORIDE: 109 mmol/L (ref 101–111)
CO2: 21 mmol/L — AB (ref 22–32)
Creatinine, Ser: 1.24 mg/dL (ref 0.61–1.24)
GFR calc non Af Amer: >60 mL/min (ref >60)
Glucose, Bld: 215 mg/dL — ABNORMAL HIGH (ref 65–99)
Potassium: 3.5 mmol/L (ref 3.5–5.1)
Sodium: 141 mmol/L (ref 135–145)

## 2015-11-17 LAB — CBC
HEMATOCRIT: 41.6 % (ref 39.0–52.0)
HEMOGLOBIN: 14.1 g/dL (ref 13.0–17.0)
MCH: 32 pg (ref 26.0–34.0)
MCHC: 33.9 g/dL (ref 30.0–36.0)
MCV: 94.3 fL (ref 78.0–100.0)
Platelets: 117 10*3/uL — ABNORMAL LOW (ref 150–400)
RBC: 4.41 MIL/uL (ref 4.22–5.81)
RDW: 12.5 % (ref 11.5–15.5)
WBC: 7.7 10*3/uL (ref 4.0–10.5)

## 2015-11-17 LAB — GLUCOSE, CAPILLARY
Glucose-Capillary: 185 mg/dL — ABNORMAL HIGH (ref 65–99)
Glucose-Capillary: 247 mg/dL — ABNORMAL HIGH (ref 65–99)

## 2015-11-17 MED ORDER — "PEN NEEDLES 3/16"" 31G X 5 MM MISC": Status: DC

## 2015-11-17 MED ORDER — DULOXETINE HCL 30 MG PO CPEP: 30.0000 mg | ORAL_CAPSULE | Freq: Every day | ORAL | Status: DC

## 2015-11-17 MED ORDER — EMTRICITABINE-TENOFOVIR AF 200-25 MG PO TABS: 1.0000 | ORAL_TABLET | Freq: Every day | ORAL | Status: DC

## 2015-11-17 MED ORDER — DOLUTEGRAVIR SODIUM 50 MG PO TABS: 50.0000 mg | ORAL_TABLET | Freq: Every day | ORAL | Status: DC

## 2015-11-17 MED ORDER — INSULIN GLARGINE 100 UNIT/ML SOLOSTAR PEN: 25.0000 [IU] | PEN_INJECTOR | Freq: Every day | SUBCUTANEOUS | Status: DC

## 2015-11-17 MED ORDER — BLOOD GLUCOSE MONITOR KIT: PACK | Status: AC

## 2015-11-17 MED ORDER — CLONAZEPAM 0.5 MG PO TABS: 0.5000 mg | ORAL_TABLET | Freq: Two times a day (BID) | ORAL | Status: AC

## 2015-11-17 MED ORDER — INSULIN ASPART 100 UNIT/ML FLEXPEN: 0.0000 [IU] | PEN_INJECTOR | Freq: Three times a day (TID) | SUBCUTANEOUS | Status: DC

## 2015-11-17 NOTE — Progress Notes (Signed)
[id number]/Spoke with wife concerning hhc for her husband.  She is concerned about going home versus going to SNF.  CSW-Suzanna Rosilyn MingsKidd is aware.  She is unable to care for him 24/7 at home due to work schedule and the patient being out of work.  Questioning for how long.  Paged Dr. Waymon AmatoHongalgi to call wife back at [phone].  Daughter is a Charity fundraiserN at another hospital but does not have time off in order to assist parents in father's care./Etter Royall,BSN,RN,CCM

## 2015-11-17 NOTE — Discharge Instructions (Signed)

## 2015-11-17 NOTE — Progress Notes (Signed)
Physical Therapy Treatment Patient Details Name: Micheal QuarryMichael Morales MRN: [id number] DOB: Nov 05, 1950 Today's Date: 11/17/2015    History of Present Illness 65 yo male admitted with depression, DKA, encephalopathy, rhabdomyolysis, AKI. Hx of severe depression, bipolar d/o, DM, HTN    PT Comments    Pt in low bed with B mats on floor.  Alert X 3 slow and groggy initially.  Assisted OOB to amb a great distance in hallway with noted balance instability.  Lateral sway and drunken gait.  HIGH FALL RISK.  [facility].  Follow Up Recommendations  Home health PT;Supervision/Assistance - 24 hour     Equipment Recommendations  Rolling walker with 5" wheels (stated he might have one his parents used but did not know for sure)    Recommendations for Other Services       Precautions / Restrictions Precautions Precaution Comments: high fall risk Restrictions Weight Bearing Restrictions: No    Mobility  Bed Mobility Overal bed mobility: Modified Independent             General bed mobility comments: slow  Transfers Overall transfer level: Needs assistance Equipment used: None Transfers: Sit to/from Stand Sit to Stand: Supervision         General transfer comment: 50% VC's on safety  Ambulation/Gait Ambulation/Gait assistance: Supervision;Min guard Ambulation Distance (Feet): 325 Feet Assistive device: Rolling walker (2 wheeled) Gait Pattern/deviations: Step-to pattern;Step-through pattern;Staggering right;Staggering left Gait velocity: decreased   General Gait Details: Assist to stabilize pt and maneuver safely with RW. Pt repeatedly steered into objects in environment.  Slow/sluggish with impaired corrective balance.    Stairs            Wheelchair Mobility    Modified Rankin (Stroke Patients Only)       Balance                                    Cognition Arousal/Alertness: Awake/alert Behavior  During Therapy: Impulsive Overall Cognitive Status: Impaired/Different from baseline Area of Impairment: Safety/judgement;Problem solving         Safety/Judgement: Decreased awareness of safety;Decreased awareness of deficits          Exercises      General Comments        Pertinent Vitals/Pain Pain Assessment: No/denies pain    Home Living                      Prior Function            PT Goals (current goals can now be found in the care plan section) Progress towards PT goals: Progressing toward goals    Frequency  Min 3X/week    PT Plan      Co-evaluation             End of Session Equipment Utilized During Treatment: Gait belt Activity Tolerance: Patient tolerated treatment well Patient left: in chair;with call bell/phone within reach;with chair alarm set;with restraints reapplied     Time: [id number] PT Time Calculation (min) (ACUTE ONLY): 25 min  Charges:  $Gait Training: 8-22 mins $Therapeutic Activity: 8-22 mins                    G Codes:      Felecia ShellingLori Araceli Coufal  PTA [facility]  Acute  Rehab Pager      [phone]

## 2015-11-17 NOTE — Progress Notes (Signed)
Micheal Morales to be D/C'd Home per MD order.  Discussed prescriptions and follow up appointments with the patient. Prescriptions given to patient, medication list explained in detail. Pt verbalized understanding.    Medication List    STOP taking these medications        busPIRone 15 MG tablet  Commonly known as:  BUSPAR     ISENTRESS 400 MG tablet  Generic drug:  raltegravir     lithium carbonate 300 MG capsule     LORazepam 0.5 MG tablet  Commonly known as:  ATIVAN     mirtazapine 15 MG tablet  Commonly known as:  REMERON     risperiDONE 1 MG tablet  Commonly known as:  RISPERDAL     temazepam 15 MG capsule  Commonly known as:  RESTORIL     TRUVADA 200-300 MG tablet  Generic drug:  emtricitabine-tenofovir      TAKE these medications        blood glucose meter kit and supplies Kit  Dispense based on patient and insurance preference. Use up to four times daily as directed. (FOR ICD-9 250.00, 250.01).     clonazePAM 0.5 MG tablet  Commonly known as:  KLONOPIN  Take 1 tablet (0.5 mg total) by mouth 2 (two) times daily.     dolutegravir 50 MG tablet  Commonly known as:  TIVICAY  Take 1 tablet (50 mg total) by mouth daily.     DULoxetine 30 MG capsule  Commonly known as:  CYMBALTA  Take 1 capsule (30 mg total) by mouth daily.     emtricitabine-tenofovir AF 200-25 MG tablet  Commonly known as:  DESCOVY  Take 1 tablet by mouth daily.     insulin aspart 100 UNIT/ML FlexPen  Commonly known as:  NOVOLOG  Inject 0-15 Units into the skin 3 (three) times daily with meals. CBG < 70: Eat or drink something and recheck, CBG 70 - 120: 0 units CBG 121 - 150: 2 units CBG 151 - 200: 3 units CBG 201 - 250: 5 units CBG 251 - 300: 8 units CBG 301 - 350: 11 units CBG 351 - 400: 15 units CBG > 400: call MD.     Insulin Glargine 100 UNIT/ML Solostar Pen  Commonly known as:  LANTUS SOLOSTAR  Inject 25 Units into the skin at bedtime.     levothyroxine 50 MCG tablet  Commonly known  as:  SYNTHROID, LEVOTHROID  Take 50 mcg by mouth daily.     Pen Needles 3/16" 31G X 5 MM Misc  Use as per directions up to 4 times.        Filed Vitals:   11/17/15 0448 11/17/15 1417  BP: 112/62 118/71  Pulse: 81 87  Temp: 98.6 F (37 C) 99 F (37.2 C)  Resp: 20 20    Skin clean, dry and intact without evidence of skin break down, no evidence of skin tears noted. IV catheter discontinued intact. Site without signs and symptoms of complications. Dressing and pressure applied. Pt denies pain at this time. No complaints noted.  An After Visit Summary was printed and given to the patient. Patient escorted via [place], and D/C home via private auto.  [name] 11/17/2015 5:30 PM

## 2015-11-17 NOTE — Discharge Summary (Addendum)
Physician Discharge Summary  Taher Vannote  ZOX:[id number]  DOB: 03-Feb-1951  DOA: 11/10/2015  PCP: Charleston Poot, MD  Outpatient Specialists: Infectious disease: Dr. Rhina Brackett Dam Psychiatry: Comer Locket, NP at [facility].  Admit date: 11/10/2015 Discharge date: 11/17/2015  Time spent: Greater than 30 minutes  Recommendations for Outpatient Follow-up:  1. Dr. Charleston Poot, PCPIn 3 days with repeat labs (CBC & CMP). [place], Psychiatry PA-C in 3 days 3. Dr. Rhina Brackett [place], Infectious Disease in 2 weeks. 4. Home health PT, OT, RN & Aide 5. Outpatient diabetes education.  Discharge Diagnoses:  Principal Problem:   Bipolar I disorder, most recent episode depressed ([place]) Active Problems:   HIV DISEASE   History of hepatitis B   Acute encephalopathy   Uncontrolled type 2 DM with hyperosmolar nonketotic hyperglycemia (HCC)   High anion gap metabolic acidosis   Depression   AKI (acute kidney injury) ([place])   Hyperosmolar (nonketotic) coma (HCC)   Protein-calorie malnutrition, severe   Encephalopathy acute   Hypernatremia   Hypokalemia   Rash and nonspecific skin eruption   Discharge Condition: Improved & Stable  Diet recommendation: Heart healthy and diabetic diet.  Filed Weights   11/13/15 0406 11/14/15 0312 11/17/15 0448  Weight: 80.1 kg (176 lb 9.4 oz) 82.1 kg (181 lb) 89.812 kg (198 lb)    History of present illness:  65 year old male with history of HIV on HAART, HTN, depression, hypothyroid, presented to ED on 11/10/15 with history of depression over past couple of weeks PTA and could not see his psychiatrist and some question regarding compliance with medications. Patient was drowsy and unable to provide history which was obtained from his spouse. He only ate sweets, drank sweet juices and kept staring at the TV. Workup in the ED showed glucose of 1179, sodium 146, potassium 5.6, bicarbonate 19 and creatinine 2.5. Anion gap 23. He was  admitted to stepdown unit for management of HONK/DKA. Since then, patient has persisting hypernatremia, hyperchloremia, acute kidney injury and altered mental status. CT head negative. Nephrology consulted for assistance with evaluation and management of acute kidney injury and electrolyte abnormalities. AKI & hypernatremia improving. ID & psychiatry consulted. Mental status has improved. Creatinine and hypernatremia have improved. Transferred to medical floor 3/24.   Hospital Course:   Acute encephalopathy - May be multifactorial related to electrolyte abnormalities (hyperglycemia, hypernatremia), acute kidney injury complicating underlying depression. - CT head without acute findings. Ammonia: Normal. UDS negative. Urine microscopy-not indicative of UTI. Chest x-ray without acute findings. TSH normal. - Treated underlying cause and monitor closely. It was felt that patient may not have been taking any of his medications for several days prior to admission. All his psych related medications were held. - Mental status change improved and possibly resolved.  Poorly controlled DM 2 with hyperosmolar state and DKA - A1c 06/2015:6.5. No diabetic medications listed on home meds PTA. Glucose on admission  1179 and AG 23. - Admitted to stepdown unit and treated per DKA protocol with aggressive IV fluids and insulin drip. DKA had improved but anion gap metabolic acidosis recurred on 3/21 and IV insulin drip and IV fluids were reinitiated. - Hemoglobin A1c 3/20:11.7. - Transitioned to Lantus and SSI on 3/22 PM.  - CBGs have improved but mildly fluctuating. Some of hyperglycemia may be related to D5 infusion which was discontinued this morning. Patient will be discharged on Lantus 25 units at bedtime and NovoLog SSI. Verified with case manager that insulin pens are covered by patient's  insurance. Nursing has provided patient and spouse with diabetes education including insulin administration. Also recommend  outpatient diabetes education.   Hypernatremia, hyperchloremia - Unclear etiology. Only came PTA but unsure as to when he last took his medications. Difficulty monitoring strict intake and output-patient keeps pulling out condom catheter. As per nursing, no large volume urine output and hence suspect no polyuria. Patient does not complain of frequent thirst either. - He might have had hyperchloremic metabolic acidosis 9/52 as a cause of his elevated anion gap. - Nephrology consultation appreciated. Discussed with Dr. 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  - Patient was treated aggressively with IV normal saline boluses/maintenance and IV D5W for almost 48 hours. Hypernatremia has resolved. Discussed with nephrology 3/24-may discontinue D5W, continue normal saline for additional 24 hours at reduced rate until oral intake consistently increases. - On 3/25, hypernatremic again at 149. DC NS and start D5 IV infusion. Encouraged increased free water intake by mouth. Nurses advised to keep jar of water next to patient. - Hypernatremia resolved.   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 Renal ultrasound without hydronephrosis. Some degree of rhabdomyolysis may be contribute in. - Nephrology consultation appreciated. Improving. Creatinine now down to 1.24.  Rhabdomyolysis - CK 4496 on 3/21. No history of fall or trauma noted. Treated with IV fluid. CK down to 246. Improved.  HIV - Continue HAART - 07/01/15: CD4: 542 and HIV RNA nondetectable - HIV RNA quantitative <20 on 11/10/15 - Some concern regarding patient's HIV medications contributing to acute renal failure, ID consulted and recommended continuing descovy and changed isn to dtgv. Outpatient follow-up with ID.  Hypothyroid - Continue  Synthroid.  Depression/anxiety - Due to somnolence, temporarily hold BuSpar, Cymbalta, mirtazapine and used when necessary IV lorazepam for anxiety and Haldol for agitation.  - Lithium level on 3/20:0.17. - Psychiatry was consulted. Started low-dose Cymbalta and Klonopin. No lithium or Depakote at discharge. Patient and spouse were counseled that patient should not drive while on Klonopin. - Outpatient follow-up with psychiatry in the next few days for further management.   Mildly abnormal LFTs - Likely from rhabdomyolysis. Improving. - HCV antibody: 0.1.  Hypokalemia - Replaced  Hypophosphatemia - Replaced  Thrombocytopenia - Unclear etiology.  - Platelets had dropped to a low of 52 but are steadily improving. Follow CBCs.  Diffuse hepatic steatosis/hepatocellular disease & 1.6 cm indeterminate hypoechoic lesion in right hepatic lobe, seen on ultrasound - Need further workup by MRI in the future-defer to outpatient follow-up with PCP .  Skin rash - Unclear etiology. No new medications started apart from insulins. Nonpruritic. No specific dermatomal localization. Probably noninfectious. Monitor closely. Skin rash on anterior trunk has significantly improved. Persisting on back-try hydrocortisone topical. - Improving. ID follow-up appreciated. No further evaluation or workup of rash.  Mechanical fall - As per RN, on 3/24 AM, patient attempted to get out of bed and fell next to his bed without sustaining any injuries. Air cabin crew. PT evaluation >recommends home health PT and 24 hour supervision and if not available, recommend SNF. - I have discussed this with patient and spouse at length on multiple occasions. Case management has also discussed with them. Patient apparently does not meet criteria for SNF admission for rehabilitation. Maximized home health services. Spouse able to arrange 24/7 supervision and assistance for a few days.  Consultants:  Nephrology  Psychiatry  Infectious Disease  Procedures:  Foley catheter 3/22 >3/24  Discharge Exam:  Complaints: Denies complaints and is anxious to go home. As per RN, no acute issues.   Filed Vitals:   11/16/15 2047 11/17/15 0126 11/17/15 0448 11/17/15 1417  BP: 123/72 115/58 112/62 118/71  Pulse: 86 83 81 87  Temp: 98.6 F (37 C) 98.4 F (36.9 C) 98.6 F (37 C) 99 F (37.2 C)  TempSrc: Oral Oral Oral Oral  Resp: '18 18 20 20  '$ Height:      Weight:   89.812 kg (198 lb)   SpO2: 99% 98% 97% 97%    General exam: Moderately built and nourished pleasant middle-aged male lying comfortably in bed. Respiratory system: clear to auscultation. No increased work of breathing. Cardiovascular system: S1 & S2 heard, RRR. No JVD, murmurs, gallops, clicks or pedal edema. . Gastrointestinal system: Abdomen is nondistended, soft and nontender. Normal bowel sounds heard. Central nervous system: Alert, oriented 3. No focal neurological deficits. No neck stiffness. Extremities: Symmetric 5 x 5 power. Skin: Diffuse, resolving maculopapular rash on trunk. Psychiatry: Pleasant and appropriate. Denies suicidal or homicidal ideations or auditory or visual hallucinations.   Discharge Instructions      Discharge Instructions    Call MD for:  difficulty breathing, headache or visual disturbances    Complete by:  As directed      Call MD for:  extreme fatigue    Complete by:  As directed      Call MD for:  hives    Complete by:  As directed      Call MD for:  persistant dizziness or light-headedness    Complete by:  As directed      Call MD for:  persistant nausea and vomiting    Complete by:  As directed      Call MD for:  severe uncontrolled pain    Complete by:  As directed      Call MD for:  temperature >100.4    Complete by:  As directed      Call MD for:    Complete by:  As directed   Confusion or altered mental state.     Diet - low sodium heart healthy     Complete by:  As directed      Diet Carb Modified    Complete by:  As directed      Increase activity slowly    Complete by:  As directed             Medication List    STOP taking these medications        busPIRone 15 MG tablet  Commonly known as:  BUSPAR     ISENTRESS 400 MG tablet  Generic drug:  raltegravir     lithium carbonate 300 MG capsule     LORazepam 0.5 MG tablet  Commonly known as:  ATIVAN     mirtazapine 15 MG tablet  Commonly known as:  REMERON     risperiDONE 1 MG tablet  Commonly known as:  RISPERDAL     temazepam 15 MG capsule  Commonly known as:  RESTORIL     TRUVADA 200-300 MG tablet  Generic drug:  emtricitabine-tenofovir      TAKE these medications        blood glucose meter kit and supplies Kit  Dispense based on patient and insurance preference. Use up to four times daily as directed. (FOR ICD-9 250.00, 250.01).  clonazePAM 0.5 MG tablet  Commonly known as:  KLONOPIN  Take 1 tablet (0.5 mg total) by mouth 2 (two) times daily.     dolutegravir 50 MG tablet  Commonly known as:  TIVICAY  Take 1 tablet (50 mg total) by mouth daily.     DULoxetine 30 MG capsule  Commonly known as:  CYMBALTA  Take 1 capsule (30 mg total) by mouth daily.     emtricitabine-tenofovir AF 200-25 MG tablet  Commonly known as:  DESCOVY  Take 1 tablet by mouth daily.     insulin aspart 100 UNIT/ML FlexPen  Commonly known as:  NOVOLOG  Inject 0-15 Units into the skin 3 (three) times daily with meals. CBG < 70: Eat or drink something and recheck, CBG 70 - 120: 0 units CBG 121 - 150: 2 units CBG 151 - 200: 3 units CBG 201 - 250: 5 units CBG 251 - 300: 8 units CBG 301 - 350: 11 units CBG 351 - 400: 15 units CBG > 400: call MD.     Insulin Glargine 100 UNIT/ML Solostar Pen  Commonly known as:  LANTUS SOLOSTAR  Inject 25 Units into the skin at bedtime.     levothyroxine 50 MCG tablet  Commonly known as:  SYNTHROID, LEVOTHROID  Take 50 mcg by mouth daily.      Pen Needles 3/16" 31G X 5 MM Misc  Use as per directions up to 4 times.       Follow-up Information    Follow up with Charleston Poot, MD. Schedule an appointment as soon as possible for a visit in 3 days.   Specialty:  Internal Medicine   Why:  [place] hospital discharge follow-up. To be seen with repeat labs (CBC & CMP).   Contact information:   [address] [place] [address] [phone]       Follow up with SHUGART,CLAY, PA-C. Schedule an appointment as soon as possible for a visit in 3 days.   Specialty:  Physician Assistant   Why:  Posthospital discharge follow-up regarding adjustment of psychiatric medications.   Contact information:   [address] [place] [address] [phone]       Follow up with Alcide Evener, MD. Schedule an appointment as soon as possible for a visit in 2 weeks.   Specialty:  Infectious Diseases   Contact information:   [address] [place] [address] [phone]       Get Medicines reviewed and adjusted: Please take all your medications with you for your next visit with your Primary MD  Please request your Primary MD to go over all hospital tests and procedure/radiological results at the follow up. Please ask your Primary MD to get all Hospital records sent to his/her office.  If you experience worsening of your admission symptoms, develop shortness of breath, life threatening emergency, suicidal or homicidal thoughts you must seek medical attention immediately by calling 911 or calling your MD immediately if symptoms less severe.  You must read complete instructions/literature along with all the possible adverse reactions/side effects for all the Medicines you take and that have been prescribed to you. Take any new Medicines after you have completely understood and accept all the possible adverse reactions/side effects.   Do not drive when taking pain medications.   Do  not take more than prescribed Pain, Sleep and Anxiety Medications  Special Instructions: If you have smoked or chewed Tobacco in the  last 2 yrs please stop smoking, stop any regular Alcohol and or any Recreational drug use.  Wear Seat belts while driving.  Please note  You were cared for by a hospitalist during your hospital stay. Once you are discharged, your primary care physician will handle any further medical issues. Please note that NO REFILLS for any discharge medications will be authorized once you are discharged, as it is imperative that you return to your primary care physician (or establish a relationship with a primary care physician if you do not have one) for your aftercare needs so that they can reassess your need for medications and monitor your lab values.    The results of significant diagnostics from this hospitalization (including imaging, microbiology, ancillary and laboratory) are listed below for reference.    Significant Diagnostic Studies: Ct Head Wo Contrast  11/12/2015  CLINICAL DATA:  65 year old male with a history of HIV. Confusion and somnolence EXAM: CT HEAD WITHOUT CONTRAST TECHNIQUE: Contiguous axial images were obtained from the base of the skull through the vertex without intravenous contrast. COMPARISON:  None. FINDINGS: Unremarkable appearance of the calvarium without acute fracture or aggressive lesion. Unremarkable appearance of the scalp soft tissues. Unremarkable appearance of the bilateral orbits. Mastoid air cells are clear. No significant paranasal sinus disease No acute intracranial hemorrhage, midline shift, or mass effect. Gray-white differentiation is maintained, without CT evidence of acute ischemia. Unremarkable configuration of the ventricles. IMPRESSION: No CT evidence of acute intracranial abnormality. Signed, Dulcy Fanny. Earleen Newport, DO [facility] Electronically Signed   By: Corrie Mckusick D.O.    On: 11/12/2015 12:45   US Abdomen Complete  11/11/2015  CLINICAL DATA:  Abdominal pain.  Acute kidney injury.  HIV. EXAM: ABDOMEN ULTRASOUND COMPLETE COMPARISON:  None. FINDINGS: Gallbladder: No gallstones or wall thickening visualized. No sonographic Murphy sign noted by sonographer. Common bile duct: Diameter: 3 mm, within normal limits. Liver: Diffusely increased echogenicity of the hepatic parenchyma, consistent with hepatic steatosis/diffuse hepatocellular disease. A focal hypoechoic lesion is seen in the right hepatic lobe adjacent gallbladder fossa measuring approximately 1.6 cm. This could represent focal fatty sparing although other hepatic mass cannot be excluded. IVC: No abnormality visualized. Pancreas: Not well visualized due to overlying bowel gas. Spleen: Size and appearance within normal limits. Right Kidney: Length: 12.3 cm. Echogenicity within normal limits. 2 cm cyst noted in upper pole of right kidney. No mass or hydronephrosis visualized. Left Kidney: Length: 13.4 cm. Echogenicity within normal limits. No mass or hydronephrosis visualized. Abdominal aorta: No aneurysm visualized. Other findings: None. IMPRESSION: No evidence of gallstones or biliary ductal dilatation. Diffuse hepatic steatosis/ hepatocellular disease. 1.6 cm indeterminate hypoechoic lesion in the right hepatic lobe adjacent gallbladder fossa. This could represent focal fatty sparing, although hepatic neoplasm cannot be excluded. Consider abdomen MRI for further characterization. No evidence of hydronephrosis. Electronically Signed   By: Earle Gell M.D.   On: 11/11/2015 14:06   Dg Chest Port 1 View  11/10/2015  CLINICAL DATA:  Weakness.  Hyperglycemia and altered mental status. EXAM: PORTABLE CHEST 1 VIEW COMPARISON:  None. FINDINGS: Elevated left hemidiaphragm with mild left lower lobe atelectasis. Negative for heart failure or pneumonia. No pleural effusion. IMPRESSION: Mild left lower lobe atelectasis Electronically  Signed   By: Franchot Gallo M.D.   On: 11/10/2015 11:52    Microbiology: Recent Results (from the past 240 hour(s))  MRSA PCR Screening     Status: None   Collection Time: 11/10/15  5:25  PM  Result Value Ref Range Status   MRSA by PCR NEGATIVE NEGATIVE Final    Comment:        The GeneXpert MRSA Assay (FDA approved for NASAL specimens only), is one component of a comprehensive MRSA colonization surveillance program. It is not intended to diagnose MRSA infection nor to guide or monitor treatment for MRSA infections.      Labs: Basic Metabolic Panel:  Recent Labs Lab 11/13/15 1816 11/14/15 0309 11/15/15 0524 11/16/15 0500 11/17/15 0556  NA 145 143 149* 140 141  K 3.4* 3.5 3.4* 3.4* 3.5  CL 117* 115* 117* 110 109  CO2 20* 20* 21* 19* 21*  GLUCOSE 222* 243* 145* 234* 215*  BUN 27* 21* '16 14 11  '$ CREATININE 1.58* 1.56* 1.41* 1.32* 1.24  CALCIUM 7.2* 7.5* 8.1* 8.1* 8.4*  MG  --   --   --  2.1  --   PHOS  --  2.0* 2.7  --   --    Liver Function Tests:  Recent Labs Lab 11/12/15 0227 11/15/15 0524  AST 96* 74*  ALT 83* 74*  ALKPHOS 93 97  BILITOT 0.9 1.4*  PROT 6.7 6.5  ALBUMIN 3.5 2.8*   No results for input(s): LIPASE, AMYLASE in the last 168 hours.  Recent Labs Lab 11/12/15 0225  AMMONIA 21   CBC:  Recent Labs Lab 11/13/15 0302 11/14/15 0309 11/15/15 0524 11/16/15 0500 11/17/15 0556  WBC 8.4 6.0 6.5 6.7 7.7  NEUTROABS  --  4.7  --   --   --   HGB 13.8 12.9* 13.3 13.7 14.1  HCT 42.2 39.3 41.5 40.1 41.6  MCV 97.7 98.0 96.7 94.8 94.3  PLT 54* 52* 62* 77* 117*   Cardiac Enzymes:  Recent Labs Lab 11/11/15 1400 11/12/15 0707 11/14/15 0309 11/15/15 0524  CKTOTAL 4496* 3026* 608* 246   BNP: BNP (last 3 results) No results for input(s): BNP in the last 8760 hours.  ProBNP (last 3 results) No results for input(s): PROBNP in the last 8760 hours.  CBG:  Recent Labs Lab 11/16/15 1237 11/16/15 1653 11/16/15 2045 11/17/15 0809  11/17/15 1140  GLUCAP 310* 176* 171* 185* 247*     Discussed at length with patient's spouse last evening and today. Updated care and answered questions. Discussed with case management.  Signed:  Vernell Leep, MD, FACP, FHM. [facility] Pager [phone]  If 7PM-7AM, please contact night-coverage [email or web address] Password [facility] 11/17/2015, 4:08 PM

## 2015-11-17 NOTE — Progress Notes (Signed)
[id number]/Rhonda Davis,BSN,RN,CCM: Spoke with csw Suzanna Kidd/does not meet bcbs glines for snf placement/Dr.Hongalgi paged and made aware of this and that patient is wanting to go home today request for [facility] also given/page sent to [facility] for home RN,PT,OTand aide.

## 2015-11-17 NOTE — Progress Notes (Signed)
[facility] that pt wife concerned about pt returning home as pt wife works and pt will not have 24 hour care. PT notes indicate pt ambulating supervision/min guard 325 feet. Pt has [employer] insurance. CSW staffed case with CSW director, Enter[employer] CorporationHope Rife and CSW Chiropodistassistant director, Wandra MannanZack Brooks who both state pt pt does not Product/process development scientistmeet BCBS qualifications for SNF & pt family need to be prepared for discharge home unless they [facility]. CSW discussed with RNCM, Marcelle SmilingRhonda Davis who reports pt wife unable to pay privately for SNF or additional home care. Pt also medically ready for discharge at this time and RNCM arranging home care.  CSW signing off.   Loletta SpecterSuzanna Saaya Procell, MSW, LCSW Clinical Social Work 5E coverage [phone]

## 2015-11-17 NOTE — Progress Notes (Signed)
INFECTIOUS DISEASE PROGRESS NOTE  ID: Micheal QuarryMichael Morales is a 65 y.o. male with  Principal Problem:   Bipolar I disorder, most recent episode depressed (HCC) Active Problems:   HIV DISEASE   History of hepatitis B   Acute encephalopathy   Uncontrolled type 2 DM with hyperosmolar nonketotic hyperglycemia (HCC)   High anion gap metabolic acidosis   Depression   AKI (acute kidney injury) (HCC)   Hyperosmolar (nonketotic) coma (HCC)   Protein-calorie malnutrition, severe   Encephalopathy acute   Hypernatremia   Hypokalemia   Rash and nonspecific skin eruption  Subjective: Without complaints  Abtx:  Anti-infectives    Start     Dose/Rate Route Frequency Ordered Stop   11/17/15 0000  dolutegravir (TIVICAY) 50 MG tablet     50 mg Oral Daily 11/17/15 1553     11/17/15 0000  emtricitabine-tenofovir AF (DESCOVY) 200-25 MG tablet     1 tablet Oral Daily 11/17/15 1553     11/14/15 1000  emtricitabine-tenofovir AF (DESCOVY) 200-25 MG per tablet 1 tablet     1 tablet Oral Daily 11/13/15 1027     11/13/15 1200  dolutegravir (TIVICAY) tablet 50 mg     50 mg Oral Daily 11/13/15 1027     11/13/15 1000  emtricitabine (EMTRIVA) capsule 200 mg  Status:  Discontinued     200 mg Oral Every 48 hours 11/12/15 1924 11/13/15 1027   11/10/15 2200  raltegravir (ISENTRESS) tablet 400 mg  Status:  Discontinued     400 mg Oral 2 times daily 11/10/15 1818 11/13/15 1027   11/10/15 2000  emtricitabine-tenofovir AF (DESCOVY) 200-25 MG per tablet 1 tablet  Status:  Discontinued     1 tablet Oral Daily 11/10/15 1838 11/12/15 1923   11/10/15 1830  emtricitabine-tenofovir AF (DESCOVY) 200-25 MG per tablet 1 tablet  Status:  Discontinued     1 tablet Oral Daily 11/10/15 1818 11/10/15 1838      Medications:  Scheduled: . clonazePAM  0.5 mg Oral BID  . dolutegravir  50 mg Oral Daily  . DULoxetine  30 mg Oral Daily  . emtricitabine-tenofovir AF  1 tablet Oral Daily  . free water  200 mL Oral Q4H  . insulin  aspart  0-15 Units Subcutaneous TID WC  . insulin aspart  0-5 Units Subcutaneous QHS  . insulin glargine  25 Units Subcutaneous QHS  . levothyroxine  50 mcg Oral QAC breakfast  . sodium chloride flush  3 mL Intravenous Q12H    Objective: Vital signs in last 24 hours: Temp:  [98.4 F (36.9 C)-99 F (37.2 C)] 99 F (37.2 C) (03/27 1417) Pulse Rate:  [81-87] 87 (03/27 1417) Resp:  [18-20] 20 (03/27 1417) BP: (112-123)/(58-72) 118/71 mmHg (03/27 1417) SpO2:  [97 %-99 %] 97 % (03/27 1417) Weight:  [89.812 kg (198 lb)] 89.812 kg (198 lb) (03/27 0448)   General appearance: alert, cooperative and no distress  Lab Results  Recent Labs  11/16/15 0500 11/17/15 0556  WBC 6.7 7.7  HGB 13.7 14.1  HCT 40.1 41.6  NA 140 141  K 3.4* 3.5  CL 110 109  CO2 19* 21*  BUN 14 11  CREATININE 1.32* 1.24   Liver Panel  Recent Labs  11/15/15 0524  PROT 6.5  ALBUMIN 2.8*  AST 74*  ALT 74*  ALKPHOS 97  BILITOT 1.4*   Sedimentation Rate No results for input(s): ESRSEDRATE in the last 72 hours. C-Reactive Protein No results for input(s): CRP in the last  72 hours.  Microbiology: Recent Results (from the past 240 hour(s))  MRSA PCR Screening     Status: None   Collection Time: 11/10/15  5:25 PM  Result Value Ref Range Status   MRSA by PCR NEGATIVE NEGATIVE Final    Comment:        The GeneXpert MRSA Assay (FDA approved for NASAL specimens only), is one component of a comprehensive MRSA colonization surveillance program. It is not intended to diagnose MRSA infection nor to guide or monitor treatment for MRSA infections.     Studies/Results: No results found.   Assessment/Plan: HIV+ Continue descovy/dtgv Discussed at length with pt that these are same meds as his prev, he should expect no new side effects.  Now will be once daily.   Newly Dx DM, DKA Will need PCP to f/u after d/c.   Generalized rash Consider skin bx if not improved at f/u.  Per primary, appears to  be resolving.   ARF improving  Hypothyroid  Bipolar         Johny Sax Infectious Diseases (pager) [phone] [email or web address] 11/17/2015, 4:56 PM  LOS: 7 days

## 2015-12-17 ENCOUNTER — Other Ambulatory Visit: Payer: Self-pay | Admitting: Infectious Disease

## 2015-12-30 ENCOUNTER — Encounter (INDEPENDENT_AMBULATORY_CARE_PROVIDER_SITE_OTHER): Payer: BLUE CROSS/BLUE SHIELD | Admitting: *Deleted

## 2015-12-30 ENCOUNTER — Other Ambulatory Visit: Payer: Self-pay | Admitting: *Deleted

## 2015-12-30 VITALS — BP 137/79 | HR 83 | Temp 98.4°F | Resp 16 | Wt 183.5 lb

## 2015-12-30 DIAGNOSIS — Z006 Encounter for examination for normal comparison and control in clinical research program: Secondary | ICD-10-CM

## 2015-12-30 DIAGNOSIS — Z21 Asymptomatic human immunodeficiency virus [HIV] infection status: Secondary | ICD-10-CM

## 2015-12-30 LAB — HIV-1 RNA QUANT-NO REFLEX-BLD

## 2015-12-30 MED ORDER — EMTRICITABINE-TENOFOVIR AF 200-25 MG PO TABS: 1.0000 | ORAL_TABLET | Freq: Every day | ORAL | Status: DC

## 2015-12-30 MED ORDER — DOLUTEGRAVIR SODIUM 50 MG PO TABS: 50.0000 mg | ORAL_TABLET | Freq: Every day | ORAL | Status: DC

## 2015-12-30 NOTE — Progress Notes (Signed)
Dr. Daiva EvesVan Dam okayed request to [facility] and stop the Truvada and Isentress. I will refill the prescription and let Micheal Morales know.

## 2015-12-30 NOTE — Progress Notes (Signed)
Micheal Morales is here today for a week 168 The HAILO Study: A Long Term follow-up of Older HIV-Infected Adults in the ACTG, an observational study addressing the issues of aging, HIV infection and Inflammation visit. He was in the hospital for 1 week in March for diabetic ketoacidosis. He said when he saw his primary care doctor in November he told him to lose some weight because he was borderline diabetic, so he changed his diet to eating mainly fruits and did lose 30 lbs, but it "messed him up'. He became very lethargic and unresponsive and had a blood sugar in the 1100s. He is much better now and his am sugars are running around 110. He is still taking truvada and isentress at home and it looked like they gave him Descovy and Tivicay in the hospital. We discussed the benefits of switching to those drugs and he is willing to switch. I will discuss with Dr. Daiva EvesVan Dam before ordering them. He does have an appt. For [employer] in July and the next research appt is in September.

## 2016-01-23 ENCOUNTER — Encounter: Payer: Self-pay | Admitting: Infectious Disease

## 2016-03-01 ENCOUNTER — Ambulatory Visit: Payer: Self-pay | Admitting: Infectious Disease

## 2016-04-19 ENCOUNTER — Other Ambulatory Visit: Payer: Self-pay

## 2016-04-19 ENCOUNTER — Ambulatory Visit
Admission: RE | Admit: 2016-04-19 | Discharge: 2016-04-19 | Disposition: A | Discharge status: Home / Self Care | Payer: BLUE CROSS/BLUE SHIELD | Source: Ambulatory Visit | Attending: Infectious Disease | Admitting: Infectious Disease

## 2016-04-19 ENCOUNTER — Ambulatory Visit (INDEPENDENT_AMBULATORY_CARE_PROVIDER_SITE_OTHER): Payer: BLUE CROSS/BLUE SHIELD | Admitting: Infectious Disease

## 2016-04-19 ENCOUNTER — Encounter: Payer: Self-pay | Admitting: Infectious Disease

## 2016-04-19 ENCOUNTER — Other Ambulatory Visit (HOSPITAL_COMMUNITY)
Admission: RE | Admit: 2016-04-19 | Discharge: 2016-04-19 | Disposition: A | Discharge status: Home / Self Care | Payer: BLUE CROSS/BLUE SHIELD | Source: Ambulatory Visit | Attending: Infectious Disease | Admitting: Infectious Disease

## 2016-04-19 VITALS — BP 134/81 | HR 88 | Temp 98.0°F | Wt 196.0 lb

## 2016-04-19 DIAGNOSIS — R509 Fever, unspecified: Secondary | ICD-10-CM | POA: Diagnosis not present

## 2016-04-19 DIAGNOSIS — M25551 Pain in right hip: Secondary | ICD-10-CM | POA: Diagnosis not present

## 2016-04-19 DIAGNOSIS — M25552 Pain in left hip: Secondary | ICD-10-CM

## 2016-04-19 DIAGNOSIS — E1051 Type 1 diabetes mellitus with diabetic peripheral angiopathy without gangrene: Secondary | ICD-10-CM | POA: Diagnosis not present

## 2016-04-19 DIAGNOSIS — M25561 Pain in right knee: Secondary | ICD-10-CM

## 2016-04-19 DIAGNOSIS — M25512 Pain in left shoulder: Secondary | ICD-10-CM

## 2016-04-19 DIAGNOSIS — B20 Human immunodeficiency virus [HIV] disease: Secondary | ICD-10-CM | POA: Diagnosis not present

## 2016-04-19 DIAGNOSIS — Z113 Encounter for screening for infections with a predominantly sexual mode of transmission: Secondary | ICD-10-CM | POA: Insufficient documentation

## 2016-04-19 DIAGNOSIS — M25511 Pain in right shoulder: Secondary | ICD-10-CM

## 2016-04-19 HISTORY — DX: Pain in right shoulder: M25.511

## 2016-04-19 HISTORY — DX: Fever, unspecified: R50.9

## 2016-04-19 HISTORY — DX: Pain in right hip: M25.551

## 2016-04-19 HISTORY — DX: Pain in right knee: M25.561

## 2016-04-19 LAB — COMPLETE METABOLIC PANEL WITH GFR
ALBUMIN: 4.2 g/dL (ref 3.6–5.1)
ALK PHOS: 63 U/L (ref 40–115)
ALT: 34 U/L (ref 9–46)
AST: 23 U/L (ref 10–35)
BILIRUBIN TOTAL: 0.6 mg/dL (ref 0.2–1.2)
BUN: 22 mg/dL (ref 7–25)
CO2: 25 mmol/L (ref 20–31)
CREATININE: 1.34 mg/dL — AB (ref 0.70–1.25)
Calcium: 9.3 mg/dL (ref 8.6–10.3)
Chloride: 104 mmol/L (ref 98–110)
GFR, EST NON AFRICAN AMERICAN: 56 mL/min — AB (ref >=60)
GFR, Est African American: 65 mL/min (ref >=60)
GLUCOSE: 131 mg/dL — AB (ref 65–99)
Potassium: 4.4 mmol/L (ref 3.5–5.3)
Sodium: 141 mmol/L (ref 135–146)
TOTAL PROTEIN: 7.6 g/dL (ref 6.1–8.1)

## 2016-04-19 LAB — CBC WITH DIFFERENTIAL/PLATELET
BASOS ABS: 0 {cells}/uL (ref 0–200)
Basophils Relative: 0 %
Eosinophils Absolute: 85 cells/uL (ref 15–500)
Eosinophils Relative: 1 %
HCT: 43.2 % (ref 38.5–50.0)
Hemoglobin: 14.3 g/dL (ref 13.2–17.1)
LYMPHS PCT: 20 %
Lymphs Abs: 1700 cells/uL (ref 850–3900)
MCH: 30.6 pg (ref 27.0–33.0)
MCHC: 33.1 g/dL (ref 32.0–36.0)
MCV: 92.5 fL (ref 80.0–100.0)
MONOS PCT: 7 %
MPV: 10.8 fL (ref 7.5–12.5)
Monocytes Absolute: 595 cells/uL (ref 200–950)
NEUTROS PCT: 72 %
Neutro Abs: 6120 cells/uL (ref 1500–7800)
PLATELETS: 247 10*3/uL (ref 140–400)
RBC: 4.67 MIL/uL (ref 4.20–5.80)
RDW: 13.6 % (ref 11.0–15.0)
WBC: 8.5 10*3/uL (ref 3.8–10.8)

## 2016-04-19 LAB — C-REACTIVE PROTEIN: CRP: 3.1 mg/dL — ABNORMAL HIGH (ref <0.60)

## 2016-04-19 MED ORDER — DOXYCYCLINE HYCLATE 100 MG PO TABS: 100.0000 mg | ORAL_TABLET | Freq: Two times a day (BID) | ORAL | 0 refills | Status: DC

## 2016-04-19 NOTE — Progress Notes (Signed)
Chief complaint: fevers to 102, right hip and knee pain  Subjective:    Patient ID: Micheal Morales, male    DOB: 10-Jul-1951, 65 y.o.   MRN: [id number]  HPI  Micheal Morales is a 65 y.o. male with HIV infection who is doing superbly well on his  antiviral regimen, isentress and truvada with undetectable viral load and health cd4 count and now on Tivicay and Descovy but never had followup labs    Lab Results  Component Value Date   HIV1RNAQUANT <20 11/13/2015   HIV1RNAQUANT <20 11/10/2015   HIV1RNAQUANT <20 07/03/2013     Lab Results  Component Value Date   CD4TABS 600 11/13/2015   CD4TABS 300 (L) 11/10/2015   CD4TABS 542 07/01/2015   Today he relates a near 7 day hx of fevers to 102 and malaise, myalgias. He has had chronic ssx of pain in hips bilaterally and right knee as well as shoulder pain and is seeing an orthopedist for these complaints.  He is outside quite a bit and did have two dogs until June but no clear cut dog or tick exposure recently.  He had TTPenia while in the hospital this spring when DM was out of control and he had a rash.  Past Medical History:  Diagnosis Date  . AKI (acute kidney injury) ([place]) 11/10/2015  . Depression   . HIV infection ([place])   . Hypertension     No past surgical history on file.  No family history on file.    Social History   Social History  . Marital status: Married    Spouse name: N/A  . Number of children: N/A  . Years of education: N/A   Social History Main Topics  . Smoking status: Never Smoker  . Smokeless tobacco: None  . Alcohol use None  . Drug use: Unknown  . Sexual activity: Not Asked   Other Topics Concern  . None   Social History Narrative  . None    Allergies  Allergen Reactions  . Cefaclor     REACTION: rash     Current Outpatient Prescriptions:  .  blood glucose meter kit and supplies KIT, Dispense based on patient and insurance preference. Use up to four times daily as directed. (FOR  ICD-9 250.00, 250.01)., Disp: 1 each, Rfl: 0 .  clonazePAM (KLONOPIN) 0.5 MG tablet, Take 1 tablet (0.5 mg total) by mouth 2 (two) times daily., Disp: 20 tablet, Rfl: 0 .  dolutegravir (TIVICAY) 50 MG tablet, Take 1 tablet (50 mg total) by mouth daily., Disp: 30 tablet, Rfl: 11 .  DULoxetine (CYMBALTA) 30 MG capsule, Take 1 capsule (30 mg total) by mouth daily., Disp: 30 capsule, Rfl: 0 .  emtricitabine-tenofovir AF (DESCOVY) 200-25 MG tablet, Take 1 tablet by mouth daily., Disp: 30 tablet, Rfl: 11 .  insulin aspart (NOVOLOG) 100 UNIT/ML FlexPen, Inject 0-15 Units into the skin 3 (three) times daily with meals. CBG < 70: Eat or drink something and recheck, CBG 70 - 120: 0 units CBG 121 - 150: 2 units CBG 151 - 200: 3 units CBG 201 - 250: 5 units CBG 251 - 300: 8 units CBG 301 - 350: 11 units CBG 351 - 400: 15 units CBG > 400: call MD., Disp: 15 mL, Rfl: 0 .  Insulin Glargine (LANTUS SOLOSTAR) 100 UNIT/ML Solostar Pen, Inject 25 Units into the skin at bedtime., Disp: 15 mL, Rfl: 0 .  Insulin Pen Needle (PEN NEEDLES 3/16") 31G X 5 MM MISC, Use as  per directions up to 4 times., Disp: 100 each, Rfl: 0 .  levothyroxine (SYNTHROID, LEVOTHROID) 50 MCG tablet, Take 50 mcg by mouth daily., Disp: , Rfl: 0 .  doxycycline (VIBRA-TABS) 100 MG tablet, Take 1 tablet (100 mg total) by mouth 2 (two) times daily., Disp: 20 tablet, Rfl: 0    Review of Systems  Constitutional: Positive for fatigue and fever. Negative for activity change, appetite change, chills and unexpected weight change.  HENT: Negative for congestion, rhinorrhea, sinus pressure, sneezing, sore throat and trouble swallowing.   Eyes: Negative for photophobia and visual disturbance.  Respiratory: Negative for cough, chest tightness, shortness of breath, wheezing and stridor.   Cardiovascular: Negative for chest pain, palpitations and leg swelling.  Gastrointestinal: Negative for abdominal distention, abdominal pain, anal bleeding, blood in stool,  constipation, diarrhea, nausea and vomiting.  Genitourinary: Negative for difficulty urinating, dysuria, flank pain and hematuria.  Musculoskeletal: Positive for arthralgias and myalgias. Negative for back pain, gait problem and joint swelling.  Skin: Negative for color change, pallor, rash and wound.  Neurological: Negative for dizziness, tremors, weakness and light-headedness.  Hematological: Negative for adenopathy. Does not bruise/bleed easily.  Psychiatric/Behavioral: Negative for agitation, behavioral problems, confusion, decreased concentration, dysphoric mood and sleep disturbance.       Objective:   Physical Exam  Constitutional: He is oriented to person, place, and time. He appears well-developed and well-nourished. No distress.  HENT:  Head: Normocephalic and atraumatic.  Mouth/Throat: Oropharynx is clear and moist. No oropharyngeal exudate.  Eyes: Conjunctivae and EOM are normal. Pupils are equal, round, and reactive to light. Right eye exhibits no discharge. Left eye exhibits no discharge. No scleral icterus.  Neck: Normal range of motion. Neck supple. No tracheal deviation present. No thyromegaly present.  Cardiovascular: Normal rate, regular rhythm and normal heart sounds.  Exam reveals no gallop and no friction rub.   No murmur heard. Pulmonary/Chest: Effort normal. No respiratory distress. He has no wheezes.  Abdominal: Soft. Bowel sounds are normal. He exhibits no distension. There is no tenderness. There is no rebound.  Musculoskeletal: He exhibits no edema.       Right shoulder: He exhibits decreased range of motion and tenderness.       Left shoulder: He exhibits decreased range of motion and tenderness.       Right hip: He exhibits decreased range of motion.       Left hip: He exhibits decreased range of motion.       Right knee: He exhibits no LCL laxity, normal patellar mobility and no MCL laxity. No tenderness found. No medial joint line, no MCL, no LCL and no  patellar tendon tenderness noted.       Left knee: Normal.  He has pain with internal and external rotation of the hip left worse than right  Lymphadenopathy:    He has no cervical adenopathy.  Neurological: He is alert and oriented to person, place, and time. He exhibits normal muscle tone. Coordination normal.  Skin: Skin is warm and dry. He is not diaphoretic. No erythema. No pallor.  Psychiatric: He has a normal mood and affect. His behavior is normal. Judgment and thought content normal.          Assessment & Plan:    FUO: check blood cultures x2, cbc c diff, CMP, ESR, CRP, CXR  I will start empiric doxycyline in case this is RMSF vs Ehrlichia  HIV: recheck labs  Ttpenia: unclear the cause  Hip, shoulder and knee pain:  he had MRI of right hip and knee at [facility] but I dont have access to this  I spent greater than 40 minutes with the patient including greater than 50% of time in face to face counsel of the patient re his FUO, his joint pains, his HIV, his Ttpenia and in coordination of his care.

## 2016-04-20 LAB — CYTOLOGY, (ORAL, ANAL, URETHRAL) ANCILLARY ONLY
CHLAMYDIA, DNA PROBE: NEGATIVE
Chlamydia: NEGATIVE
Neisseria Gonorrhea: NEGATIVE
Neisseria Gonorrhea: NEGATIVE

## 2016-04-20 LAB — RPR

## 2016-04-20 LAB — SEDIMENTATION RATE: Sed Rate: 32 mm/hr — ABNORMAL HIGH (ref 0–20)

## 2016-04-20 LAB — T-HELPER CELL (CD4) - (RCID CLINIC ONLY)
CD4 % Helper T Cell: 53 % (ref 33–55)
CD4 T Cell Abs: 820 /uL (ref 400–2700)

## 2016-04-22 LAB — HIV RNA, RTPCR W/R GT (RTI, PI,INT): HIV 1 RNA Quant: <20 copies/mL

## 2016-04-26 LAB — CULTURE, BLOOD (SINGLE)
ORGANISM ID, BACTERIA: NO GROWTH
Organism ID, Bacteria: NO GROWTH

## 2016-05-10 ENCOUNTER — Encounter (INDEPENDENT_AMBULATORY_CARE_PROVIDER_SITE_OTHER): Payer: BLUE CROSS/BLUE SHIELD | Admitting: *Deleted

## 2016-05-10 VITALS — BP 147/87 | HR 91 | Temp 98.6°F | Resp 16 | Ht 67.0 in | Wt 200.0 lb

## 2016-05-10 DIAGNOSIS — Z006 Encounter for examination for normal comparison and control in clinical research program: Secondary | ICD-10-CM

## 2016-05-10 LAB — LIPID PANEL
Cholesterol: 167 mg/dL (ref 125–200)
HDL: 30 mg/dL — AB (ref >=40)
LDL CALC: 73 mg/dL (ref <130)
Total CHOL/HDL Ratio: 5.6 Ratio — ABNORMAL HIGH (ref <=5.0)
Triglycerides: 319 mg/dL — ABNORMAL HIGH (ref <150)
VLDL: 64 mg/dL — ABNORMAL HIGH (ref <30)

## 2016-05-10 LAB — CBC WITH DIFFERENTIAL/PLATELET
BASOS ABS: 0 {cells}/uL (ref 0–200)
Basophils Relative: 0 %
Eosinophils Absolute: 204 cells/uL (ref 15–500)
Eosinophils Relative: 3 %
HEMATOCRIT: 41.8 % (ref 38.5–50.0)
Hemoglobin: 13.8 g/dL (ref 13.2–17.1)
LYMPHS PCT: 15 %
Lymphs Abs: 1020 cells/uL (ref 850–3900)
MCH: 31.2 pg (ref 27.0–33.0)
MCHC: 33 g/dL (ref 32.0–36.0)
MCV: 94.4 fL (ref 80.0–100.0)
MONO ABS: 476 {cells}/uL (ref 200–950)
MPV: 10.3 fL (ref 7.5–12.5)
Monocytes Relative: 7 %
NEUTROS PCT: 75 %
Neutro Abs: 5100 cells/uL (ref 1500–7800)
Platelets: 159 10*3/uL (ref 140–400)
RBC: 4.43 MIL/uL (ref 4.20–5.80)
RDW: 13.9 % (ref 11.0–15.0)
WBC: 6.8 10*3/uL (ref 3.8–10.8)

## 2016-05-10 LAB — COMPREHENSIVE METABOLIC PANEL
ALBUMIN: 4.4 g/dL (ref 3.6–5.1)
ALT: 26 U/L (ref 9–46)
AST: 23 U/L (ref 10–35)
Alkaline Phosphatase: 63 U/L (ref 40–115)
BILIRUBIN TOTAL: 0.5 mg/dL (ref 0.2–1.2)
BUN: 15 mg/dL (ref 7–25)
CALCIUM: 9.5 mg/dL (ref 8.6–10.3)
CHLORIDE: 103 mmol/L (ref 98–110)
CO2: 25 mmol/L (ref 20–31)
CREATININE: 1.13 mg/dL (ref 0.70–1.25)
Glucose, Bld: 155 mg/dL — ABNORMAL HIGH (ref 65–99)
POTASSIUM: 4.5 mmol/L (ref 3.5–5.3)
Sodium: 139 mmol/L (ref 135–146)
TOTAL PROTEIN: 7.8 g/dL (ref 6.1–8.1)

## 2016-05-10 NOTE — Progress Notes (Signed)
Micheal Morales is here for his week 192 visit for The HAILO Study: A Long Term follow-up of Older HIV-Infected Adults in the ACTG, an observational study addressing the issues of aging, HIV infection and Inflammation. He reports significant pain and numbness in his fingers that has been going on for about 4 months. He also has some numbness in his toes bilat. He said he saw his regular PCP about it and he told him he didn't think it was neuropathy and gave him diclofenac which hasn't helped. His grips have decreased in his rt hand since last year and he says it has been affecting him some. He will be seeing Dr. Daiva EvesVan Dam in followup next week and I told him to discuss the neuropathy with him, especially since he now has diabetes. He will return to see research in March.

## 2016-05-11 LAB — HEMOGLOBIN A1C
HEMOGLOBIN A1C: 7.3 % — AB (ref <5.7)
Mean Plasma Glucose: 163 mg/dL

## 2016-05-11 LAB — PROTEIN, URINE, RANDOM: Total Protein, Urine: 123 mg/dL — ABNORMAL HIGH (ref 5–25)

## 2016-05-11 LAB — HEPATITIS C ANTIBODY: HCV AB: NEGATIVE

## 2016-05-11 LAB — CREATININE, URINE, RANDOM: Creatinine, Urine: 221 mg/dL (ref 20–370)

## 2016-05-19 ENCOUNTER — Encounter: Payer: Self-pay | Admitting: Infectious Disease

## 2016-05-19 ENCOUNTER — Ambulatory Visit (INDEPENDENT_AMBULATORY_CARE_PROVIDER_SITE_OTHER): Payer: BLUE CROSS/BLUE SHIELD | Admitting: Infectious Disease

## 2016-05-19 VITALS — BP 145/84 | HR 83 | Temp 98.9°F | Ht 68.0 in | Wt 194.0 lb

## 2016-05-19 DIAGNOSIS — Z23 Encounter for immunization: Secondary | ICD-10-CM | POA: Diagnosis not present

## 2016-05-19 DIAGNOSIS — E1100 Type 2 diabetes mellitus with hyperosmolarity without nonketotic hyperglycemic-hyperosmolar coma (NKHHC): Secondary | ICD-10-CM

## 2016-05-19 DIAGNOSIS — R509 Fever, unspecified: Secondary | ICD-10-CM

## 2016-05-19 DIAGNOSIS — R202 Paresthesia of skin: Secondary | ICD-10-CM

## 2016-05-19 DIAGNOSIS — G5793 Unspecified mononeuropathy of bilateral lower limbs: Secondary | ICD-10-CM

## 2016-05-19 DIAGNOSIS — B20 Human immunodeficiency virus [HIV] disease: Secondary | ICD-10-CM

## 2016-05-19 DIAGNOSIS — R200 Anesthesia of skin: Secondary | ICD-10-CM

## 2016-05-19 HISTORY — DX: Anesthesia of skin: R20.0

## 2016-05-19 HISTORY — DX: Unspecified mononeuropathy of bilateral lower limbs: G57.93

## 2016-05-19 NOTE — Progress Notes (Signed)
Chief complaint: Painful numbness in both hands and also numbness in his feet Subjective:    Patient ID: Micheal Morales, male    DOB: [birth year], 65 y.o.   MRN: [id number]  HPI  Micheal Morales is a 65 y.o. male with HIV infection who is doing superbly well on his  antiviral regimen, isentress and truvada with undetectable viral load and health cd4 count and now on Tivicay and Descovy With a undesirable viral load and healthy CD4 count.   Lab Results  Component Value Date   HIV1RNAQUANT <20 04/19/2016   HIV1RNAQUANT <20 11/13/2015   HIV1RNAQUANT <20 11/10/2015     Lab Results  Component Value Date   CD4TABS 820 04/19/2016   CD4TABS 600 11/13/2015   CD4TABS 300 (L) 11/10/2015   We last time he was having fevers and we treated him for tickborne illness with doxycycline. His fevers have since resolved he's had worsening pain and numbness in his hands bilaterally and also his feet. The numbness and pain in his hands have improved with corticosteroids and was blunting of his hands. He does relate a history of using a bidirectional saw for 6 months the per a lot of strain on his hands and wonders if this might have been due with it. I told this could indeed a lady with especially if this was a carpal tunnel syndrome type phenomenon in his hands. .  Past Medical History:  Diagnosis Date  . AKI (acute kidney injury) ([place]) 11/10/2015  . Bilateral hip pain 04/19/2016  . Bilateral shoulder pain 04/19/2016  . Depression   . FUO (fever of unknown origin) 04/19/2016  . HIV infection ([place])   . Hypertension   . Right knee pain 04/19/2016    No past surgical history on file.  No family history on file.    Social History   Social History  . Marital status: Married    Spouse name: N/A  . Number of children: N/A  . Years of education: N/A   Social History Main Topics  . Smoking status: Never Smoker  . Smokeless tobacco: Not on file  . Alcohol use Not on file  . Drug use: Unknown  .  Sexual activity: Not on file   Other Topics Concern  . Not on file   Social History Narrative  . No narrative on file    Allergies  Allergen Reactions  . Cefaclor     REACTION: rash     Current Outpatient Prescriptions:  .  blood glucose meter kit and supplies KIT, Dispense based on patient and insurance preference. Use up to four times daily as directed. (FOR ICD-9 250.00, 250.01)., Disp: 1 each, Rfl: 0 .  clonazePAM (KLONOPIN) 0.5 MG tablet, Take 1 tablet (0.5 mg total) by mouth 2 (two) times daily., Disp: 20 tablet, Rfl: 0 .  dolutegravir (TIVICAY) 50 MG tablet, Take 1 tablet (50 mg total) by mouth daily., Disp: 30 tablet, Rfl: 11 .  doxycycline (VIBRA-TABS) 100 MG tablet, Take 1 tablet (100 mg total) by mouth 2 (two) times daily., Disp: 20 tablet, Rfl: 0 .  DULoxetine (CYMBALTA) 30 MG capsule, Take 1 capsule (30 mg total) by mouth daily., Disp: 30 capsule, Rfl: 0 .  emtricitabine-tenofovir AF (DESCOVY) 200-25 MG tablet, Take 1 tablet by mouth daily., Disp: 30 tablet, Rfl: 11 .  insulin aspart (NOVOLOG) 100 UNIT/ML FlexPen, Inject 0-15 Units into the skin 3 (three) times daily with meals. CBG < 70: Eat or drink something and recheck, CBG 70 - 120:  0 units CBG 121 - 150: 2 units CBG 151 - 200: 3 units CBG 201 - 250: 5 units CBG 251 - 300: 8 units CBG 301 - 350: 11 units CBG 351 - 400: 15 units CBG > 400: call MD., Disp: 15 mL, Rfl: 0 .  Insulin Glargine (LANTUS SOLOSTAR) 100 UNIT/ML Solostar Pen, Inject 25 Units into the skin at bedtime., Disp: 15 mL, Rfl: 0 .  Insulin Pen Needle (PEN NEEDLES 3/16") 31G X 5 MM MISC, Use as per directions up to 4 times., Disp: 100 each, Rfl: 0 .  levothyroxine (SYNTHROID, LEVOTHROID) 50 MCG tablet, Take 50 mcg by mouth daily., Disp: , Rfl: 0    Review of Systems  Constitutional: Negative for activity change, appetite change, chills, fatigue, fever and unexpected weight change.  HENT: Negative for congestion, rhinorrhea, sinus pressure, sneezing, sore  throat and trouble swallowing.   Eyes: Negative for photophobia and visual disturbance.  Respiratory: Negative for cough, chest tightness, shortness of breath, wheezing and stridor.   Cardiovascular: Negative for chest pain, palpitations and leg swelling.  Gastrointestinal: Negative for abdominal distention, abdominal pain, anal bleeding, blood in stool, constipation, diarrhea, nausea and vomiting.  Genitourinary: Negative for difficulty urinating, dysuria, flank pain and hematuria.  Musculoskeletal: Positive for arthralgias and myalgias. Negative for back pain, gait problem and joint swelling.  Skin: Negative for color change, pallor, rash and wound.  Neurological: Positive for weakness. Negative for dizziness, tremors and light-headedness.  Hematological: Negative for adenopathy. Does not bruise/bleed easily.  Psychiatric/Behavioral: Negative for agitation, behavioral problems, confusion, decreased concentration, dysphoric mood and sleep disturbance.       Objective:   Physical Exam  Constitutional: He is oriented to person, place, and time. He appears well-developed and well-nourished. No distress.  HENT:  Head: Normocephalic and atraumatic.  Mouth/Throat: Oropharynx is clear and moist. No oropharyngeal exudate.  Eyes: Conjunctivae and EOM are normal. Pupils are equal, round, and reactive to light. Right eye exhibits no discharge. Left eye exhibits no discharge. No scleral icterus.  Neck: Normal range of motion. Neck supple. No tracheal deviation present. No thyromegaly present.  Cardiovascular: Normal rate, regular rhythm and normal heart sounds.  Exam reveals no gallop and no friction rub.   No murmur heard. Pulmonary/Chest: Effort normal. No respiratory distress. He has no wheezes.  Abdominal: Soft. Bowel sounds are normal. He exhibits no distension. There is no tenderness. There is no rebound.  Musculoskeletal: He exhibits no edema.       Right knee: He exhibits no LCL laxity,  normal patellar mobility and no MCL laxity. No tenderness found. No medial joint line, no MCL, no LCL and no patellar tendon tenderness noted.       Left knee: Normal.       Arms: Lymphadenopathy:    He has no cervical adenopathy.  Neurological: He is alert and oriented to person, place, and time. He exhibits normal muscle tone. Coordination normal.  Skin: Skin is warm and dry. He is not diaphoretic. No erythema. No pallor.  Psychiatric: He has a normal mood and affect. His behavior is normal. Judgment and thought content normal.          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 It is improved with splinting.  He is to have nerve conduction studies still.   I spent greater than 25  minutes with the patient including greater than 50% of time in face to face counsel of the patient re his painful numbness in hands and feet his prior fever his HIV, and in coordination of his care.

## 2016-11-01 ENCOUNTER — Encounter (INDEPENDENT_AMBULATORY_CARE_PROVIDER_SITE_OTHER): Payer: Self-pay | Admitting: *Deleted

## 2016-11-01 VITALS — BP 127/88 | HR 114 | Temp 98.4°F | Wt 200.8 lb

## 2016-11-01 DIAGNOSIS — Z006 Encounter for examination for normal comparison and control in clinical research program: Secondary | ICD-10-CM

## 2016-11-01 NOTE — Progress Notes (Signed)
Micheal Morales is here for his week 216 visit for The HAILO Study: A Long Term follow-up of Older HIV-Infected Adults in the ACTG, an observational study addressing the issues of aging, HIV infection and Inflammation . He says he has been having trouble with keeping his blood sugars down and has gained a little weight. 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 He denies any new diagnoses or medications. He will return in August for the next study visit.

## 2016-11-06 LAB — HIV-1 RNA QUANT-NO REFLEX-BLD: HIV-1 RNA Viral Load: 41

## 2016-11-22 ENCOUNTER — Encounter: Payer: Self-pay | Admitting: *Deleted

## 2016-11-29 ENCOUNTER — Other Ambulatory Visit: Payer: Self-pay | Admitting: Infectious Disease

## 2016-11-29 DIAGNOSIS — Z21 Asymptomatic human immunodeficiency virus [HIV] infection status: Secondary | ICD-10-CM

## 2017-03-22 IMAGING — CT CT HEAD W/O CM
2 of 4 series · 13 of 30 positions shown, 16 images · non-contrast
Comparison: None.

CLINICAL DATA: 64-year-old male with a history of HIV. Confusion
and somnolence

EXAM:
CT HEAD WITHOUT CONTRAST
TECHNIQUE: Contiguous axial images were obtained from the base of the skull
through the vertex without intravenous contrast.

[Series 4: head 5.0 h45s · axial · 0.42mm/px · z∈[-104,-89]mm · 2 of 10 slices shown (1 of 2)]
[im 4/10  brain]
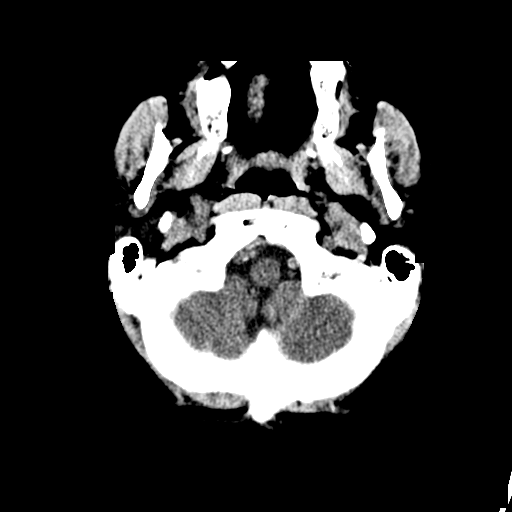
[im 7/10  brain]
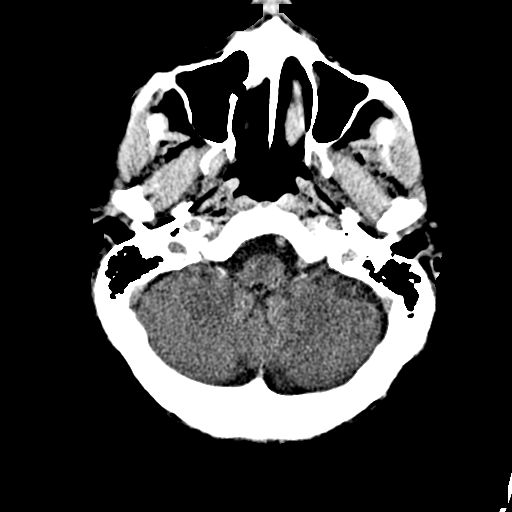

[Series 8: head 5.0 h45s · axial · 0.42mm/px · z∈[-92,+38]mm · 11 of 32 slices shown, 14 images (2 of 2)]
[im 3/32  brain]
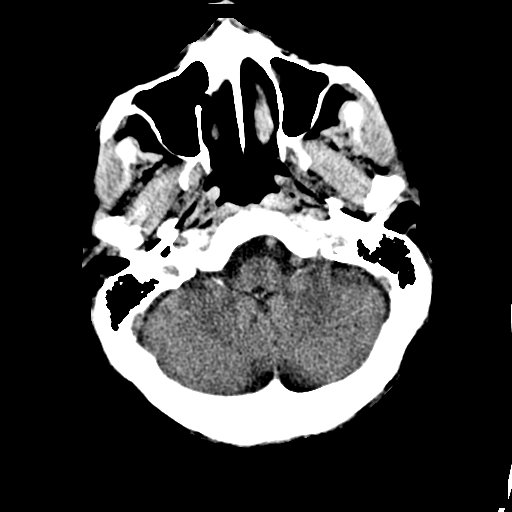
[im 3/32  bone]
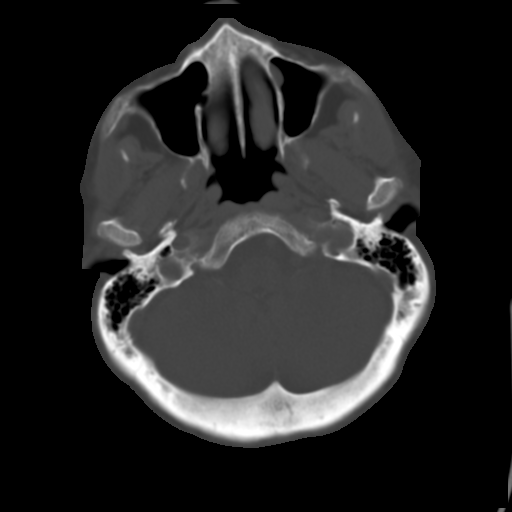
[im 6/32  brain]
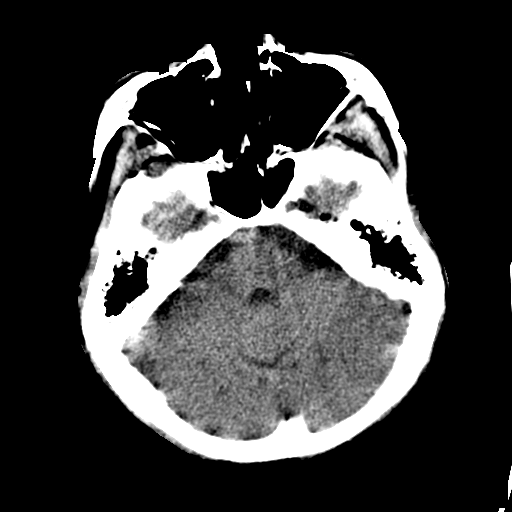
[im 8/32  brain]
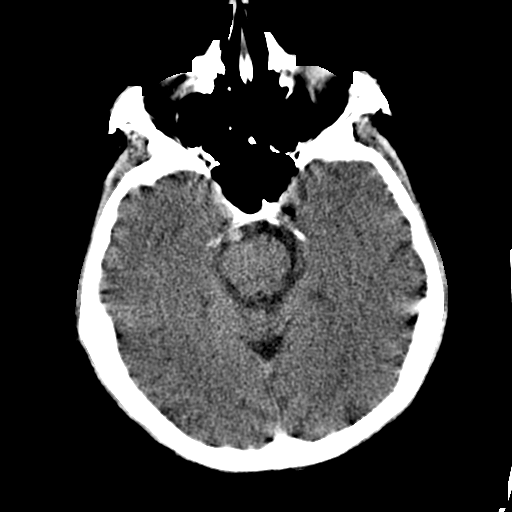
[im 11/32  brain]
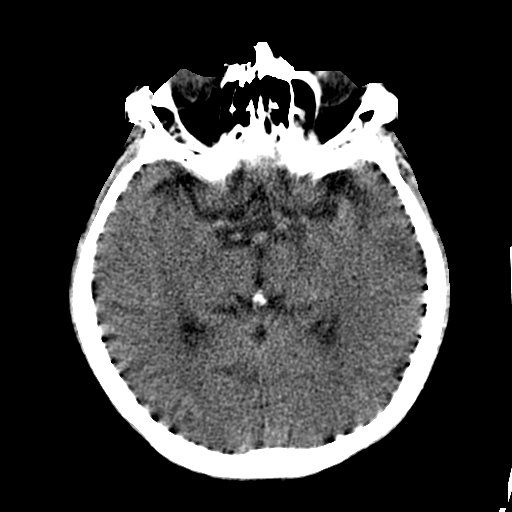
[im 13/32  brain]
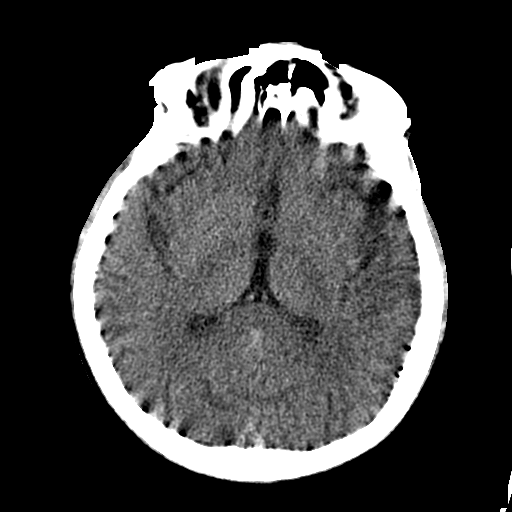
[im 13/32  bone]
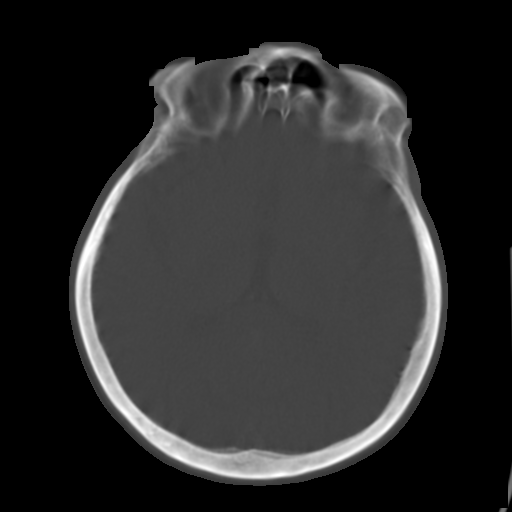
[im 16/32  brain]
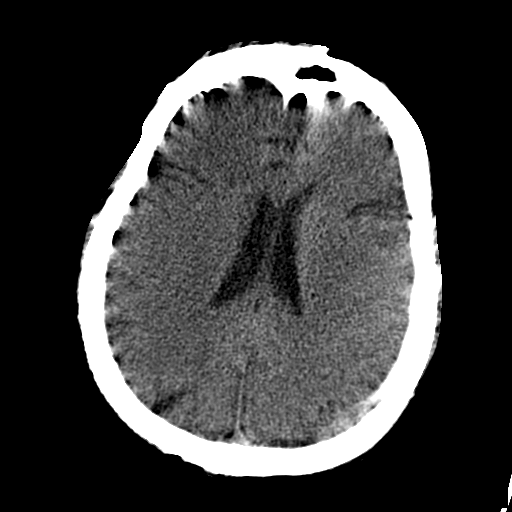
[im 19/32  brain]
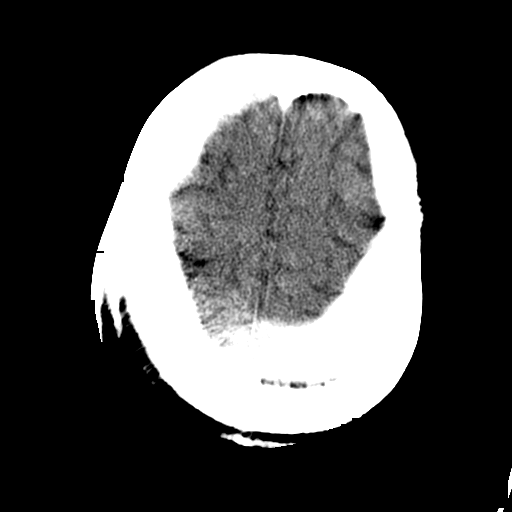
[im 21/32  brain]
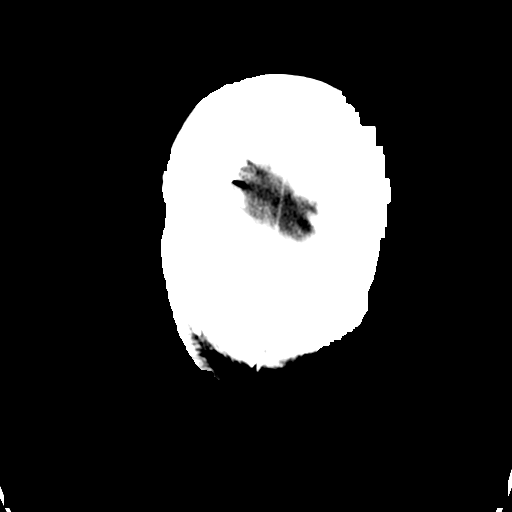
[im 24/32  brain]
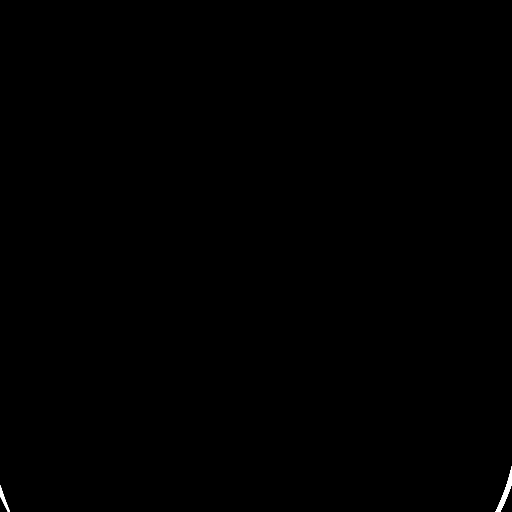
[im 24/32  bone]
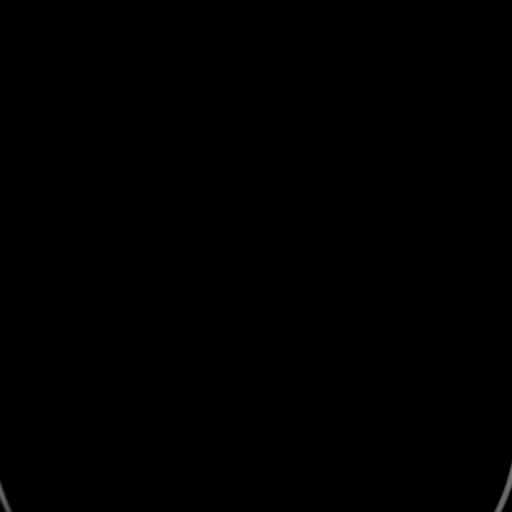
[im 26/32  brain]
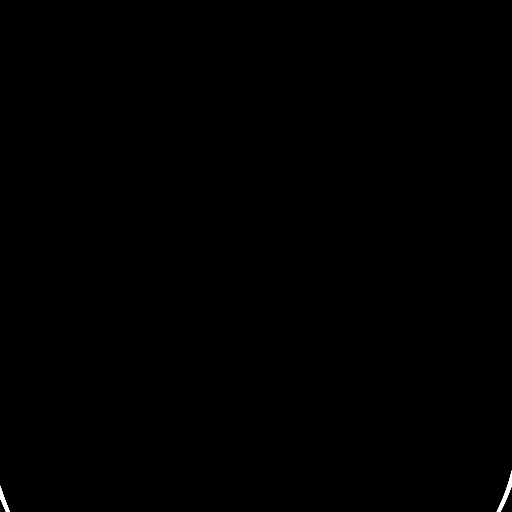
[im 29/32  brain]
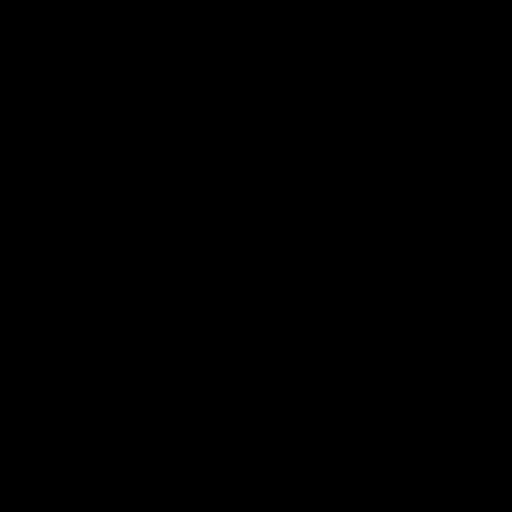

[13 of 30 positions shown; findings below may reference images not displayed]

FINDINGS: Unremarkable appearance of the calvarium without acute fracture or
aggressive lesion.

Unremarkable appearance of the scalp soft tissues.

Unremarkable appearance of the bilateral orbits.

Mastoid air cells are clear.

No significant paranasal sinus disease

No acute intracranial hemorrhage, midline shift, or mass effect.

Gray-white differentiation is maintained, without CT evidence of
acute ischemia.

Unremarkable configuration of the ventricles.
IMPRESSION: No CT evidence of acute intracranial abnormality.

## 2017-04-18 ENCOUNTER — Encounter (INDEPENDENT_AMBULATORY_CARE_PROVIDER_SITE_OTHER): Payer: BLUE CROSS/BLUE SHIELD | Admitting: *Deleted

## 2017-04-18 VITALS — BP 135/79 | HR 88 | Temp 98.2°F | Wt 193.8 lb

## 2017-04-18 DIAGNOSIS — Z006 Encounter for examination for normal comparison and control in clinical research program: Secondary | ICD-10-CM

## 2017-04-18 LAB — COMPREHENSIVE METABOLIC PANEL
ALBUMIN: 4.6 g/dL (ref 3.6–5.1)
ALT: 56 U/L — ABNORMAL HIGH (ref 9–46)
AST: 37 U/L — ABNORMAL HIGH (ref 10–35)
Alkaline Phosphatase: 87 U/L (ref 40–115)
BUN: 16 mg/dL (ref 7–25)
CALCIUM: 9.7 mg/dL (ref 8.6–10.3)
CHLORIDE: 100 mmol/L (ref 98–110)
CO2: 28 mmol/L (ref 20–32)
CREATININE: 1.25 mg/dL (ref 0.70–1.25)
GLUCOSE: 285 mg/dL — AB (ref 65–99)
Potassium: 4.1 mmol/L (ref 3.5–5.3)
SODIUM: 136 mmol/L (ref 135–146)
Total Bilirubin: 0.9 mg/dL (ref 0.2–1.2)
Total Protein: 7.8 g/dL (ref 6.1–8.1)

## 2017-04-18 LAB — LIPID PANEL
Cholesterol: 143 mg/dL (ref <200)
HDL: 24 mg/dL — AB (ref >40)
LDL CALC: 62 mg/dL (ref <100)
TRIGLYCERIDES: 287 mg/dL — AB (ref <150)
Total CHOL/HDL Ratio: 6 Ratio — ABNORMAL HIGH (ref <5.0)
VLDL: 57 mg/dL — AB (ref <30)

## 2017-04-18 LAB — CD4/CD8 (T-HELPER/T-SUPPRESSOR CELL)
CD4 COUNT: 623
CD4 T CELL HELPER: 62.3
CD8 % Suppressor T Cell: 22.4
CD8 T CELL ABS: 224

## 2017-04-18 LAB — HIV-1 RNA QUANT-NO REFLEX-BLD: HIV 1 RNA VIRAL LOAD: 101

## 2017-04-18 NOTE — Progress Notes (Addendum)
Micheal Morales is here for his week 240 visit for The HAILO Study: A Long Term follow-up of Older HIV-Infected Adults in the ACTG, an observational study addressing the issues of aging, HIV infection and Inflammation  He says he hasn't been feeling as well as he was when he was on the Truvada and Raltegravir over a year ago, but he has also been dealing with a new diagnosis of diabetes. He has been having increasing numbness/tingling in his toes bilat. That seems to be getting worse. He says his primary MD is referring him to neurology for consult. He also is complaining of having dry, chapped lips and the inside of his mouth burning. He say it feels like when he first got diagnosed with HIV and he had thrush. There are no noticeable sores or thrush in his mouth, but it does appear a little red. He will followup with his PCP. I did schedule him an appointment with Dr. Daiva Eves in September.  04/19/17   [name]'s glucose was 285 and A1c was 11.1. I called him to let him know that he needed to let his PCP know that his blood sugars weren't well controlled. He admitted to me that he hadn't been taking his lantus every night nor checking his sugars like he should be. He did say that he would be more vigilant about it and get back on track with it. He has a PCP appointment soon.

## 2017-04-19 LAB — CREATININE, URINE, RANDOM: CREATININE, URINE: 122 mg/dL (ref 20–370)

## 2017-04-19 LAB — PROTEIN, URINE, RANDOM: TOTAL PROTEIN, URINE: 100 mg/dL — AB (ref 5–25)

## 2017-04-19 LAB — HEMOGLOBIN A1C
HEMOGLOBIN A1C: 11.1 % — AB (ref <5.7)
Mean Plasma Glucose: 272 mg/dL

## 2017-05-18 ENCOUNTER — Encounter: Payer: Self-pay | Admitting: Infectious Disease

## 2017-05-18 ENCOUNTER — Ambulatory Visit (INDEPENDENT_AMBULATORY_CARE_PROVIDER_SITE_OTHER): Payer: BLUE CROSS/BLUE SHIELD | Admitting: Infectious Disease

## 2017-05-18 VITALS — BP 145/94 | HR 87 | Temp 98.7°F | Wt 193.0 lb

## 2017-05-18 DIAGNOSIS — Z23 Encounter for immunization: Secondary | ICD-10-CM | POA: Diagnosis not present

## 2017-05-18 DIAGNOSIS — E1042 Type 1 diabetes mellitus with diabetic polyneuropathy: Secondary | ICD-10-CM

## 2017-05-18 DIAGNOSIS — E1100 Type 2 diabetes mellitus with hyperosmolarity without nonketotic hyperglycemic-hyperosmolar coma (NKHHC): Secondary | ICD-10-CM | POA: Diagnosis not present

## 2017-05-18 DIAGNOSIS — B20 Human immunodeficiency virus [HIV] disease: Secondary | ICD-10-CM

## 2017-05-18 DIAGNOSIS — E1065 Type 1 diabetes mellitus with hyperglycemia: Secondary | ICD-10-CM

## 2017-05-18 DIAGNOSIS — F3130 Bipolar disorder, current episode depressed, mild or moderate severity, unspecified: Secondary | ICD-10-CM

## 2017-05-18 DIAGNOSIS — IMO0002 Reserved for concepts with insufficient information to code with codable children: Secondary | ICD-10-CM

## 2017-05-18 NOTE — Progress Notes (Signed)
Chief complaint: followup for HIV disease on meds Subjective:    Patient ID: Micheal Morales, male    DOB: [birth year], 66 y.o.   MRN: [id number]  HPI  Micheal Morales is a 66 y.o. male with HIV doing very well on his regimen of Tivicay and Descovy though with a likely viral blip to 101 and CD4 at 623 via ACTG labs.    Lab Results  Component Value Date   HIV1RNAQUANT <20 04/19/2016   HIV1RNAQUANT <20 11/13/2015   HIV1RNAQUANT <20 11/10/2015     Lab Results  Component Value Date   CD4TABS 820 04/19/2016   CD4TABS 600 11/13/2015   CD4TABS 300 (L) 11/10/2015    His DM is NOT well controlled with A1C now at 11.    Past Medical History:  Diagnosis Date  . AKI (acute kidney injury) ([place]) 11/10/2015  . Bilateral hip pain 04/19/2016  . Bilateral shoulder pain 04/19/2016  . Depression   . FUO (fever of unknown origin) 04/19/2016  . HIV infection ([place])   . Hypertension   . 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 Numbness and tingling in hands 05/19/2016  . Right knee pain 04/19/2016    No past surgical history on file.  No family history on file.    Social History   Social History  . Marital status: Married    Spouse name: N/A  . Number of children: N/A  . Years of education: N/A   Social History Main Topics  . Smoking status: Never Smoker  . Smokeless tobacco: Not on file  . Alcohol use 0.6 oz/week    1 Glasses of wine per week     Comment: occ  . Drug use: No  . Sexual activity: Not Currently   Other Topics Concern  . Not on file   Social History Narrative  . No narrative on file    Allergies  Allergen Reactions  . Cefaclor     REACTION: rash     Current Outpatient Prescriptions:  .  blood glucose meter kit and supplies KIT, Dispense based on patient and insurance preference. Use up to four times daily as directed. (FOR ICD-9 250.00, 250.01)., Disp: 1 each, Rfl: 0 .  clonazePAM (KLONOPIN) 0.5 MG tablet, Take 1 tablet (0.5 mg total) by mouth 2 (two) times  daily., Disp: 20 tablet, Rfl: 0 .  DESCOVY 200-25 MG tablet, TAKE 1 TABLET BY MOUTH DAILY., Disp: 30 tablet, Rfl: 4 .  diclofenac (VOLTAREN) 75 MG EC tablet, Take 75 mg by mouth 2 (two) times daily with a meal., Disp: , Rfl: 0 .  DULoxetine (CYMBALTA) 60 MG capsule, , Disp: , Rfl: 0 .  fluorouracil (EFUDEX) 5 % cream, apply topically to affected area twice a day for 2 weeks (EXPECT IRRITATION)., Disp: , Rfl: 0 .  insulin aspart (NOVOLOG) 100 UNIT/ML FlexPen, Inject 0-15 Units into the skin 3 (three) times daily with meals. CBG < 70: Eat or drink something and recheck, CBG 70 - 120: 0 units CBG 121 - 150: 2 units CBG 151 - 200: 3 units CBG 201 - 250: 5 units CBG 251 - 300: 8 units CBG 301 - 350: 11 units CBG 351 - 400: 15 units CBG > 400: call MD., Disp: 15 mL, Rfl: 0 .  Insulin Glargine (LANTUS SOLOSTAR) 100 UNIT/ML Solostar Pen, Inject 25 Units into the skin at bedtime., Disp: 15 mL, Rfl: 0 .  Insulin Pen Needle (PEN NEEDLES 3/16") 31G X 5 MM MISC, Use as per directions  up to 4 times., Disp: 100 each, Rfl: 0 .  levothyroxine (SYNTHROID, LEVOTHROID) 50 MCG tablet, Take 50 mcg by mouth daily., Disp: , Rfl: 0 .  MICROLET LANCETS MISC, , Disp: , Rfl: 0 .  TIVICAY 50 MG tablet, TAKE 1 TABLET ('50MG'$  TOTAL) BY MOUTH EVERY DAY, Disp: 30 tablet, Rfl: 4    Review of Systems  Constitutional: Negative for activity change, appetite change, chills, fatigue, fever and unexpected weight change.  HENT: Negative for congestion, rhinorrhea, sinus pressure, sneezing, sore throat and trouble swallowing.   Eyes: Negative for photophobia and visual disturbance.  Respiratory: Negative for cough, chest tightness, shortness of breath, wheezing and stridor.   Cardiovascular: Negative for chest pain, palpitations and leg swelling.  Gastrointestinal: Negative for abdominal distention, abdominal pain, anal bleeding, blood in stool, constipation, diarrhea, nausea and vomiting.  Genitourinary: Negative for difficulty  urinating, dysuria, flank pain and hematuria.  Musculoskeletal: Negative for arthralgias, back pain, gait problem, joint swelling and myalgias.  Skin: Negative for color change, pallor, rash and wound.  Neurological: Negative for dizziness, tremors, weakness and light-headedness.  Hematological: Negative for adenopathy. Does not bruise/bleed easily.  Psychiatric/Behavioral: Negative for agitation, behavioral problems, confusion, decreased concentration, dysphoric mood and sleep disturbance.       Objective:   Physical Exam  Constitutional: He is oriented to person, place, and time. He appears well-developed and well-nourished. No distress.  HENT:  Head: Normocephalic and atraumatic.  Mouth/Throat: Oropharynx is clear and moist. No oropharyngeal exudate.  Eyes: Pupils are equal, round, and reactive to light. Conjunctivae and EOM are normal. Right eye exhibits no discharge. Left eye exhibits no discharge. No scleral icterus.  Neck: Normal range of motion. Neck supple. No tracheal deviation present. No thyromegaly present.  Cardiovascular: Normal rate and regular rhythm.   Pulmonary/Chest: Effort normal. No respiratory distress. He has no wheezes.  Abdominal: Soft. Bowel sounds are normal. He exhibits no distension. There is no tenderness.  Musculoskeletal: He exhibits no edema.       Right knee: He exhibits no LCL laxity, normal patellar mobility and no MCL laxity. No tenderness found. No medial joint line, no MCL, no LCL and no patellar tendon tenderness noted.       Left knee: Normal.  Lymphadenopathy:    He has no cervical adenopathy.  Neurological: He is alert and oriented to person, place, and time. He exhibits normal muscle tone. Coordination normal.  Skin: Skin is warm and dry. He is not diaphoretic. No erythema. No pallor.  Psychiatric: He has a normal mood and affect. His behavior is normal. Judgment and thought content normal.          Assessment & Plan:    HIV: we  discussed change to [facility] but he ONLY wants to get labs via research. Therefore he will call one- two months prior to research visit and we will switch him then and check labs when he comes for that visit and he can see Maudie Mercury and then see me. RTC in one year. Flu shot given  IDDM: poorly controlled   I spent greater than 25 minutes with the patient including greater than 50% of time in face to face counsel of his care.

## 2017-05-19 ENCOUNTER — Encounter: Payer: Self-pay | Admitting: *Deleted

## 2017-05-20 ENCOUNTER — Other Ambulatory Visit: Payer: Self-pay | Admitting: Infectious Disease

## 2017-05-20 DIAGNOSIS — Z21 Asymptomatic human immunodeficiency virus [HIV] infection status: Secondary | ICD-10-CM

## 2017-06-29 ENCOUNTER — Telehealth: Payer: Self-pay | Admitting: *Deleted

## 2017-06-29 NOTE — Telephone Encounter (Signed)
Micheal Morales called and is concerned that his elevated blood sugars are caused by the tivicay he has been on. He said that his primary care provider has been trying to get his sugars down but they are having problems with it. Did you want to go ahead and switch him to something else? He said you mentioned it at his last visit.

## 2017-06-29 NOTE — Telephone Encounter (Signed)
The TIVICAY has NOTHING to do with his DM. It is fine to [facility] which would interact less with metformin but he is on insulin or in need of insulin if I am remembering correctly

## 2017-06-30 ENCOUNTER — Telehealth: Payer: Self-pay | Admitting: Pharmacist

## 2017-06-30 MED ORDER — BICTEGRAVIR-EMTRICITAB-TENOFOV 50-200-25 MG PO TABS: 1.0000 | ORAL_TABLET | Freq: Every day | ORAL | 6 refills | Status: DC

## 2017-06-30 NOTE — Telephone Encounter (Signed)
Spoke with Casimiro NeedleMichael today about his concerns with Tivicay elevating his blood sugar. Apparently, a physician he had recently spoken with told him that hyperglycemia was one of the primary side effects of Tivicay. Explained to Casimiro NeedleMichael that Chaney Bornivicay was very unlikely to cause elevated blood sugars and that drug manufacturers have to report ALL side effects that occur during clinical trials. Hyperglycemia is listed as an adverse effect of Tivicay on Micromedex but this is likely because of enrollment of diabetic patients in clinical trials and not due to the drug itself. I went ahead and sent in ChelanBiktarvy for Casimiro NeedleMichael to his specialty pharmacy to ease his mind and per a previous discussion he had with Dr. Daiva EvesVan Dam..

## 2017-09-21 ENCOUNTER — Other Ambulatory Visit: Payer: Self-pay | Admitting: *Deleted

## 2017-09-21 MED ORDER — BICTEGRAVIR-EMTRICITAB-TENOFOV 50-200-25 MG PO TABS: 1.0000 | ORAL_TABLET | Freq: Every day | ORAL | 6 refills | Status: DC

## 2017-09-22 ENCOUNTER — Other Ambulatory Visit: Payer: Self-pay | Admitting: Pharmacist Clinician (PhC)/ Clinical Pharmacy Specialist

## 2017-09-22 MED ORDER — BICTEGRAVIR-EMTRICITAB-TENOFOV 50-200-25 MG PO TABS: 1.0000 | ORAL_TABLET | Freq: Every day | ORAL | 6 refills | Status: DC

## 2017-09-22 NOTE — Progress Notes (Signed)
[facility] so we can do PAF

## 2017-09-26 ENCOUNTER — Encounter: Payer: Self-pay | Admitting: *Deleted

## 2017-09-27 ENCOUNTER — Other Ambulatory Visit: Payer: Self-pay | Admitting: Pharmacist

## 2017-09-27 DIAGNOSIS — B20 Human immunodeficiency virus [HIV] disease: Secondary | ICD-10-CM

## 2017-09-27 MED ORDER — BICTEGRAVIR-EMTRICITAB-TENOFOV 50-200-25 MG PO TABS: 1.0000 | ORAL_TABLET | Freq: Every day | ORAL | 11 refills | Status: DC

## 2017-10-05 ENCOUNTER — Encounter (INDEPENDENT_AMBULATORY_CARE_PROVIDER_SITE_OTHER): Payer: Self-pay | Admitting: *Deleted

## 2017-10-05 VITALS — BP 168/89 | HR 84 | Temp 98.6°F | Wt 190.5 lb

## 2017-10-05 DIAGNOSIS — Z006 Encounter for examination for normal comparison and control in clinical research program: Secondary | ICD-10-CM

## 2017-10-05 NOTE — Progress Notes (Signed)
Micheal Morales is here for his week 264 visit for A5322. He says his diabetes is much better now and he is watching better how he eats. He says he was recently evaluated for numbness in his toes and fingers by a neurologist who felt it was mainly related to his diabetes. Some of his lab work there showed elevated protein and IgM whoc sent him to see an oncology doctor. She evaluated him for everything and didn't find any issues. 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 He will return for the next study visit in October.

## 2017-11-07 LAB — HIV-1 RNA QUANT-NO REFLEX-BLD: HIV-1 RNA Viral Load: 115

## 2017-11-15 ENCOUNTER — Encounter: Payer: Self-pay | Admitting: *Deleted

## 2018-05-24 ENCOUNTER — Other Ambulatory Visit: Payer: Self-pay | Admitting: Infectious Disease

## 2018-05-26 ENCOUNTER — Other Ambulatory Visit: Payer: Self-pay | Admitting: Infectious Disease

## 2018-06-06 ENCOUNTER — Other Ambulatory Visit: Payer: Self-pay | Admitting: *Deleted

## 2018-06-06 ENCOUNTER — Encounter (INDEPENDENT_AMBULATORY_CARE_PROVIDER_SITE_OTHER): Payer: Self-pay | Admitting: *Deleted

## 2018-06-06 VITALS — BP 150/79 | HR 60 | Temp 98.2°F | Wt 182.5 lb

## 2018-06-06 DIAGNOSIS — Z006 Encounter for examination for normal comparison and control in clinical research program: Secondary | ICD-10-CM

## 2018-06-06 MED ORDER — BICTEGRAVIR-EMTRICITAB-TENOFOV 50-200-25 MG PO TABS: 1.0000 | ORAL_TABLET | Freq: Every day | ORAL | 11 refills | Status: DC

## 2018-06-06 NOTE — Research (Addendum)
Micheal Morales was here for his week 288 visit for The HAILO Study: A Long Term follow-up of Older HIV-Infected Adults in the ACTG, an observational study addressing the issues of aging, HIV infection and Inflammation.  He has been doing a keto diet and has lost some weight and feels better. He said his hgb A1c got up to 14 and his primary MD was concerned so he has been trying to lose weight and watching what he eats and has been able to cut back on the ssi. He is due to retun in February for the next study visit. I scheduled him for an appt. With Dr. Daiva Eves in December. He wanted his Biktarvy refilled with the stipulation he keeps his appt. With Dr.Van Dam. He said his primary Md gave him a flushot rescently.

## 2018-06-07 LAB — LIPID PANEL
Cholesterol: 139 mg/dL (ref <200)
HDL: 33 mg/dL — AB (ref >40)
LDL CHOLESTEROL (CALC): 82 mg/dL
Non-HDL Cholesterol (Calc): 106 mg/dL (calc) (ref <130)
TRIGLYCERIDES: 142 mg/dL (ref <150)
Total CHOL/HDL Ratio: 4.2 (calc) (ref <5.0)

## 2018-06-07 LAB — COMPREHENSIVE METABOLIC PANEL
AG Ratio: 1.7 (calc) (ref 1.0–2.5)
ALT: 35 U/L (ref 9–46)
AST: 25 U/L (ref 10–35)
Albumin: 4.9 g/dL (ref 3.6–5.1)
Alkaline phosphatase (APISO): 54 U/L (ref 40–115)
BUN: 15 mg/dL (ref 7–25)
CO2: 23 mmol/L (ref 20–32)
CREATININE: 1.22 mg/dL (ref 0.70–1.25)
Calcium: 9.9 mg/dL (ref 8.6–10.3)
Chloride: 105 mmol/L (ref 98–110)
GLUCOSE: 161 mg/dL — AB (ref 65–99)
Globulin: 2.9 g/dL (calc) (ref 1.9–3.7)
Potassium: 4.3 mmol/L (ref 3.5–5.3)
SODIUM: 141 mmol/L (ref 135–146)
TOTAL PROTEIN: 7.8 g/dL (ref 6.1–8.1)
Total Bilirubin: 1 mg/dL (ref 0.2–1.2)

## 2018-06-07 LAB — HEPATITIS C ANTIBODY
Hepatitis C Ab: NONREACTIVE
SIGNAL TO CUT-OFF: 0.02 (ref <1.00)

## 2018-06-07 LAB — HEMOGLOBIN A1C
EAG (MMOL/L): 10.3 (calc)
Hgb A1c MFr Bld: 8.1 % of total Hgb — ABNORMAL HIGH (ref <5.7)
MEAN PLASMA GLUCOSE: 186 (calc)

## 2018-06-07 LAB — PROTEIN / CREATININE RATIO, URINE
Creatinine, Urine: 151 mg/dL (ref 20–320)
PROTEIN/CREAT RATIO: 788 mg/g{creat} — AB (ref 22–128)
PROTEIN/CREATININE RATIO: 0.788 mg/mg{creat} — AB (ref 0.022–0.12)
TOTAL PROTEIN, URINE: 119 mg/dL — AB (ref 5–25)

## 2018-07-07 ENCOUNTER — Encounter: Payer: Self-pay | Admitting: *Deleted

## 2018-07-07 LAB — HIV-1 RNA QUANT-NO REFLEX-BLD
CD4 % Helper T Cell: 55.6
CD4 Count: 556
CD8 % Suppressor T Cell: 24.5
CD8 T Cell Abs: 245
HIV-1 RNA Viral Load: <40

## 2018-07-25 ENCOUNTER — Encounter: Payer: Self-pay | Admitting: Infectious Disease

## 2018-07-25 ENCOUNTER — Ambulatory Visit (INDEPENDENT_AMBULATORY_CARE_PROVIDER_SITE_OTHER): Payer: Medicare HMO | Admitting: Infectious Disease

## 2018-07-25 VITALS — BP 162/78 | HR 64 | Temp 98.6°F | Wt 190.0 lb

## 2018-07-25 DIAGNOSIS — Z8619 Personal history of other infectious and parasitic diseases: Secondary | ICD-10-CM

## 2018-07-25 DIAGNOSIS — Z23 Encounter for immunization: Secondary | ICD-10-CM

## 2018-07-25 DIAGNOSIS — E1100 Type 2 diabetes mellitus with hyperosmolarity without nonketotic hyperglycemic-hyperosmolar coma (NKHHC): Secondary | ICD-10-CM | POA: Diagnosis not present

## 2018-07-25 DIAGNOSIS — H918X1 Other specified hearing loss, right ear: Secondary | ICD-10-CM

## 2018-07-25 DIAGNOSIS — B20 Human immunodeficiency virus [HIV] disease: Secondary | ICD-10-CM | POA: Diagnosis not present

## 2018-07-25 DIAGNOSIS — H9190 Unspecified hearing loss, unspecified ear: Secondary | ICD-10-CM

## 2018-07-25 HISTORY — DX: Unspecified hearing loss, unspecified ear: H91.90

## 2018-07-25 MED ORDER — BICTEGRAVIR-EMTRICITAB-TENOFOV 50-200-25 MG PO TABS: 1.0000 | ORAL_TABLET | Freq: Every day | ORAL | 11 refills | Status: DC

## 2018-07-25 NOTE — Addendum Note (Signed)
Addended by: Alesia MorinPOOLE, TRAVIS F on: 07/25/2018 11:15 AM   Modules accepted: Orders

## 2018-07-25 NOTE — Progress Notes (Signed)
Chief complaint: followup for HIV disease on meds, complaining of bilateral hearing loss Subjective:    Patient ID: Micheal Morales, male    DOB: August 13, 1951, 67 y.o.   MRN: [id number]  HPI  Micheal Morales is a 67 y.o. male with HIV doing very well on his new regimen of Biktarvy.  His has an undetectable viral load and a healthy CD4 count.  He has had hearing loss over the last several months and was asking me if he should washout his ears.  I told him that he needed to have formal evaluation by audiology and we will make such a referral.  He has been trying to improve his diabetes by eating more of a ketogenic diet he has not lost weight but his A1c has dropped from 11-8.  He is being followed by primary care physician for work-up for this and treatment       Past Medical History:  Diagnosis Date  . AKI (acute kidney injury) ([place]) 11/10/2015  . Bilateral hip pain 04/19/2016  . Bilateral shoulder pain 04/19/2016  . Depression   . FUO (fever of unknown origin) 04/19/2016  . HIV infection ([place])   . Hypertension   . 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 Numbness and tingling in hands 05/19/2016  . Right knee pain 04/19/2016    No past surgical history on file.  No family history on file.    Social History   Socioeconomic History  . Marital status: Married    Spouse name: Not on file  . Number of children: Not on file  . Years of education: Not on file  . Highest education level: Not on file  Occupational History  . Not on file  Social Needs  . Financial resource strain: Not on file  . Food insecurity:    Worry: Not on file    Inability: Not on file  . Transportation needs:    Medical: Not on file    Non-medical: Not on file  Tobacco Use  . Smoking status: Never Smoker  Substance and Sexual Activity  . Alcohol use: Yes    Alcohol/week: 1.0 standard drinks    Types: 1 Glasses of wine per week    Comment: occ  . Drug use: No  . Sexual activity: Not Currently   Lifestyle  . Physical activity:    Days per week: Not on file    Minutes per session: Not on file  . Stress: Not on file  Relationships  . Social connections:    Talks on phone: Not on file    Gets together: Not on file    Attends religious service: Not on file    Active member of club or organization: Not on file    Attends meetings of clubs or organizations: Not on file    Relationship status: Not on file  Other Topics Concern  . Not on file  Social History Narrative  . Not on file    Allergies  Allergen Reactions  . Cefaclor     REACTION: rash     Current Outpatient Medications:  .  bictegravir-emtricitabine-tenofovir AF (BIKTARVY) 50-200-25 MG TABS tablet, Take 1 tablet by mouth daily., Disp: 30 tablet, Rfl: 11 .  blood glucose meter kit and supplies KIT, Dispense based on patient and insurance preference. Use up to four times daily as directed. (FOR ICD-9 250.00, 250.01)., Disp: 1 each, Rfl: 0 .  clonazePAM (KLONOPIN) 0.5 MG tablet, Take 1 tablet (0.5 mg total) by mouth 2 (two)  times daily., Disp: 20 tablet, Rfl: 0 .  DULoxetine (CYMBALTA) 60 MG capsule, , Disp: , Rfl: 0 .  fluorouracil (EFUDEX) 5 % cream, apply topically to affected area twice a day for 2 weeks (EXPECT IRRITATION)., Disp: , Rfl: 0 .  insulin aspart (NOVOLOG) 100 UNIT/ML FlexPen, Inject 0-15 Units into the skin 3 (three) times daily with meals. CBG < 70: Eat or drink something and recheck, CBG 70 - 120: 0 units CBG 121 - 150: 2 units CBG 151 - 200: 3 units CBG 201 - 250: 5 units CBG 251 - 300: 8 units CBG 301 - 350: 11 units CBG 351 - 400: 15 units CBG > 400: call MD., Disp: 15 mL, Rfl: 0 .  Insulin Glargine (LANTUS SOLOSTAR) 100 UNIT/ML Solostar Pen, Inject 25 Units into the skin at bedtime., Disp: 15 mL, Rfl: 0 .  Insulin Pen Needle (PEN NEEDLES 3/16") 31G X 5 MM MISC, Use as per directions up to 4 times., Disp: 100 each, Rfl: 0 .  levothyroxine (SYNTHROID, LEVOTHROID) 50 MCG tablet, Take 50 mcg by mouth  daily., Disp: , Rfl: 0 .  MICROLET LANCETS MISC, , Disp: , Rfl: 0    Review of Systems  Constitutional: Negative for activity change, appetite change, chills, fatigue, fever and unexpected weight change.  HENT: Positive for hearing loss. Negative for congestion, rhinorrhea, sinus pressure, sneezing, sore throat and trouble swallowing.   Eyes: Negative for photophobia and visual disturbance.  Respiratory: Negative for cough, chest tightness, shortness of breath, wheezing and stridor.   Cardiovascular: Negative for chest pain, palpitations and leg swelling.  Gastrointestinal: Negative for abdominal distention, abdominal pain, anal bleeding, blood in stool, constipation, diarrhea, nausea and vomiting.  Genitourinary: Negative for difficulty urinating, dysuria, flank pain and hematuria.  Musculoskeletal: Negative for arthralgias, back pain, gait problem, joint swelling and myalgias.  Skin: Negative for color change, pallor, rash and wound.  Neurological: Negative for dizziness, tremors, weakness and light-headedness.  Hematological: Negative for adenopathy. Does not bruise/bleed easily.  Psychiatric/Behavioral: Negative for agitation, behavioral problems, confusion, decreased concentration, dysphoric mood and sleep disturbance.       Objective:   Physical Exam  Constitutional: He is oriented to person, place, and time. He appears well-developed and well-nourished. No distress.  HENT:  Head: Normocephalic and atraumatic.  Right Ear: Decreased hearing is noted.  Left Ear: Decreased hearing is noted.  Mouth/Throat: Oropharynx is clear and moist. No oropharyngeal exudate.  Eyes: Pupils are equal, round, and reactive to light. Conjunctivae and EOM are normal. Right eye exhibits no discharge. Left eye exhibits no discharge. No scleral icterus.  Neck: Normal range of motion. Neck supple. No tracheal deviation present. No thyromegaly present.  Cardiovascular: Normal rate and regular rhythm.   Pulmonary/Chest: Effort normal. No respiratory distress. He has no wheezes.  Abdominal: Soft. Bowel sounds are normal. He exhibits no distension. There is no tenderness.  Musculoskeletal: He exhibits no edema.       Right knee: He exhibits no LCL laxity, normal patellar mobility and no MCL laxity. No tenderness found. No medial joint line, no MCL, no LCL and no patellar tendon tenderness noted.       Left knee: Normal.  Lymphadenopathy:    He has no cervical adenopathy.  Neurological: He is alert and oriented to person, place, and time. He exhibits normal muscle tone. Coordination normal.  Skin: Skin is warm and dry. He is not diaphoretic. No erythema. No pallor.  Psychiatric: He has a normal mood and affect.  His behavior is normal. Judgment and thought content normal.          Assessment & Plan:    HIV: BIKTARVY and check labs in a year  Obesity: If he would lose 20 pounds at least he might very well get rid of his diabetes.  He could try to do carbohydrate restricted diet carefully to do this.  Does not recall if switch to integrase Rona Ravens transfer inhibitor coincided with weight gain that he had.  By the chart it looks as if he is had a weight between 178-ish to 200 since  2008    DM: Better controlled yet he is says he is not taking insulin right now.  Wants referring to audiology.  I spent greater than 25 minutes with the patient including greater than 50% of time in face to face counsel of the patient Ellyn Hack various diets and abilities to lose weight with them how they work, reviewing his labs and in coordination of his care.

## 2018-09-18 ENCOUNTER — Telehealth: Payer: Self-pay | Admitting: Pharmacy Technician

## 2018-09-18 NOTE — Telephone Encounter (Signed)
Needing copay assistance-has medicare

## 2018-09-19 ENCOUNTER — Telehealth: Payer: Self-pay | Admitting: Pharmacy Technician

## 2018-09-19 NOTE — Telephone Encounter (Signed)
PAF through 09/19/2019 ID  [id number] B  [id number] P  [id number] G  [id number]

## 2018-09-21 NOTE — Telephone Encounter (Signed)
error 

## 2018-10-11 ENCOUNTER — Ambulatory Visit: Payer: Self-pay | Admitting: Audiology

## 2019-05-25 ENCOUNTER — Other Ambulatory Visit: Payer: Self-pay | Admitting: Infectious Disease

## 2019-07-18 ENCOUNTER — Other Ambulatory Visit: Payer: Self-pay | Admitting: Infectious Disease

## 2019-07-18 ENCOUNTER — Telehealth: Payer: Self-pay | Admitting: *Deleted

## 2019-07-18 DIAGNOSIS — B20 Human immunodeficiency virus [HIV] disease: Secondary | ICD-10-CM

## 2019-07-18 NOTE — Telephone Encounter (Signed)
Patient called back, stated that Maudie Mercury already called him from Research and he scheduled with her. RN offered to make appointment same day with DR Tommy Medal, patient declined, stating he would rather have all of his labs resulted before his appointment with Dr Tommy Medal.  RN offered a lab appointment 11/30 or labs same day 12/7, patient declined, stating he would rather schedule after the new year.  He would prefer to schedule in person when he is here 12/7 with Maudie Mercury. Landis Gandy, RN

## 2019-07-18 NOTE — Telephone Encounter (Signed)
Received refill request for Biktarvy. Patient last seen 07/2018, was advised to return 1 year. No follow up appointment on schedule. RN refilled 30 day supply with note asking patient to contact us for refill.  RN called patient's listed number, left message asking him to call [facility] to schedule his next appointment. Landis Gandy, RN

## 2019-07-30 ENCOUNTER — Encounter (INDEPENDENT_AMBULATORY_CARE_PROVIDER_SITE_OTHER): Payer: Self-pay | Admitting: *Deleted

## 2019-07-30 ENCOUNTER — Other Ambulatory Visit: Payer: Self-pay

## 2019-07-30 VITALS — BP 155/72 | HR 82 | Temp 98.8°F | Ht 67.0 in | Wt 189.2 lb

## 2019-07-30 DIAGNOSIS — Z006 Encounter for examination for normal comparison and control in clinical research program: Secondary | ICD-10-CM

## 2019-07-30 LAB — HIV-1 RNA QUANT-NO REFLEX-BLD
CD4 % Helper T Cell: 51.1
CD4 Count: 460
CD8 % Suppressor T Cell: 23.6
CD8 T Cell Abs: 212
HIV-1 RNA Viral Load: <40

## 2019-07-30 NOTE — Research (Signed)
Micheal Morales was here for his week 39 visit for The HAILO Study: A Long Term follow-up of Older HIV-Infected Adults in the ACTG, an observational study addressing the issues of aging, HIV infection and Inflammation. He says he is just getting over having bronchitis at Thanksgiving. He was checked for Covid and was negative. He was given and antibiotic and steroid shot for treatment. He does say his neuropathy in his feet and fingers is a little better than last year and has been taking duloxetine for that since then. He says his blood sugars are doing well and he hasn't needed any novolog insulin. I scheduled him an appt. To see Dr. Tommy Medal in 2 days at which tim he will need his flu shot.

## 2019-07-31 LAB — CREATININE, URINE, RANDOM: Creatinine, Urine: 88 mg/dL (ref 20–320)

## 2019-07-31 LAB — LIPID PANEL
Cholesterol: 111 mg/dL (ref <200)
HDL: 32 mg/dL — ABNORMAL LOW (ref >=40)
LDL Cholesterol (Calc): 55 mg/dL (calc)
Non-HDL Cholesterol (Calc): 79 mg/dL (calc) (ref <130)
Total CHOL/HDL Ratio: 3.5 (calc) (ref <5.0)
Triglycerides: 154 mg/dL — ABNORMAL HIGH (ref <150)

## 2019-07-31 LAB — COMPREHENSIVE METABOLIC PANEL
AG Ratio: 1.7 (calc) (ref 1.0–2.5)
ALT: 27 U/L (ref 9–46)
AST: 21 U/L (ref 10–35)
Albumin: 4.8 g/dL (ref 3.6–5.1)
Alkaline phosphatase (APISO): 72 U/L (ref 35–144)
BUN: 18 mg/dL (ref 7–25)
CO2: 28 mmol/L (ref 20–32)
Calcium: 9.9 mg/dL (ref 8.6–10.3)
Chloride: 98 mmol/L (ref 98–110)
Creat: 1.12 mg/dL (ref 0.70–1.25)
Globulin: 2.9 g/dL (calc) (ref 1.9–3.7)
Glucose, Bld: 186 mg/dL — ABNORMAL HIGH (ref 65–99)
Potassium: 4.6 mmol/L (ref 3.5–5.3)
Sodium: 136 mmol/L (ref 135–146)
Total Bilirubin: 0.8 mg/dL (ref 0.2–1.2)
Total Protein: 7.7 g/dL (ref 6.1–8.1)

## 2019-07-31 LAB — HEMOGLOBIN A1C
Hgb A1c MFr Bld: 7.6 % of total Hgb — ABNORMAL HIGH (ref <5.7)
Mean Plasma Glucose: 171 (calc)
eAG (mmol/L): 9.5 (calc)

## 2019-07-31 LAB — PROTEIN, URINE, RANDOM: Total Protein, Urine: 49 mg/dL — ABNORMAL HIGH (ref 5–25)

## 2019-08-01 ENCOUNTER — Ambulatory Visit (INDEPENDENT_AMBULATORY_CARE_PROVIDER_SITE_OTHER): Payer: Medicare HMO | Admitting: Infectious Disease

## 2019-08-01 ENCOUNTER — Encounter: Payer: Self-pay | Admitting: Infectious Disease

## 2019-08-01 ENCOUNTER — Other Ambulatory Visit: Payer: Self-pay

## 2019-08-01 VITALS — BP 144/82 | HR 98 | Wt 187.6 lb

## 2019-08-01 DIAGNOSIS — Z79899 Other long term (current) drug therapy: Secondary | ICD-10-CM

## 2019-08-01 DIAGNOSIS — Z23 Encounter for immunization: Secondary | ICD-10-CM | POA: Diagnosis not present

## 2019-08-01 DIAGNOSIS — J209 Acute bronchitis, unspecified: Secondary | ICD-10-CM

## 2019-08-01 DIAGNOSIS — B20 Human immunodeficiency virus [HIV] disease: Secondary | ICD-10-CM

## 2019-08-01 DIAGNOSIS — E119 Type 2 diabetes mellitus without complications: Secondary | ICD-10-CM | POA: Diagnosis not present

## 2019-08-01 DIAGNOSIS — Z0000 Encounter for general adult medical examination without abnormal findings: Secondary | ICD-10-CM

## 2019-08-01 HISTORY — DX: Acute bronchitis, unspecified: J20.9

## 2019-08-01 MED ORDER — BIKTARVY 50-200-25 MG PO TABS: 1.0000 | ORAL_TABLET | Freq: Every day | ORAL | 11 refills | Status: DC

## 2019-08-01 NOTE — Progress Notes (Signed)
Chief complaint: followup for HIV disease on meds, complaining of symptoms of a cough continuing despite antibiotics.    Subjective:    Patient ID: Micheal Morales, male    DOB: 1950-10-17, 68 y.o.   MRN: [id number]  HPI  Micheal Morales is a 68 y.o. male with HIV doing very well on his new regimen of Biktarvy.  His has an undetectable viral load and a healthy CD4 count.  [place] comes in today for routine care.  He states that approximately the day after Thanksgiving he developed cough and productive sputum was seen by his primary care physician who tested him for Covid.  He was also given an antibiotic and has completed now nearly 10 days of antibiotics.  He is still coughing a bit concerned that he needs more antibiotics.  His diabetes has become much better controlled through his weight loss.  He says he is no longer on any insulin.  He does not however appear to be on any oral meds and I told him with an A1c of 7.6 he still needed to be on some type of medication even though this was a dramatic improvement over his last A1cs.     Past Medical History:  Diagnosis Date  . AKI (acute kidney injury) ([place]) 11/10/2015  . Bilateral hip pain 04/19/2016  . Bilateral shoulder pain 04/19/2016  . Depression   . FUO (fever of unknown origin) 04/19/2016  . Hearing loss 07/25/2018  . HIV infection ([place])   . Hypertension   . 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 Numbness and tingling in hands 05/19/2016  . Right knee pain 04/19/2016    No past surgical history on file.  No family history on file.    Social History   Socioeconomic History  . Marital status: Married    Spouse name: Not on file  . Number of children: Not on file  . Years of education: Not on file  . Highest education level: Not on file  Occupational History  . Not on file  Social Needs  . Financial resource strain: Not on file  . Food insecurity    Worry: Not on file    Inability: Not on file  . Transportation  needs    Medical: Not on file    Non-medical: Not on file  Tobacco Use  . Smoking status: Never Smoker  . Smokeless tobacco: Never Used  Substance and Sexual Activity  . Alcohol use: Yes    Alcohol/week: 1.0 standard drinks    Types: 1 Glasses of wine per week    Comment: occ  . Drug use: No  . Sexual activity: Not Currently  Lifestyle  . Physical activity    Days per week: Not on file    Minutes per session: Not on file  . Stress: Not on file  Relationships  . Social Herbalist on phone: Not on file    Gets together: Not on file    Attends religious service: Not on file    Active member of club or organization: Not on file    Attends meetings of clubs or organizations: Not on file    Relationship status: Not on file  Other Topics Concern  . Not on file  Social History Narrative  . Not on file    Allergies  Allergen Reactions  . Cefaclor     REACTION: rash     Current Outpatient Medications:  .  BIKTARVY 50-200-25 MG TABS tablet, TAKE 1 TABLET  BY MOUTH ONCE DAILY., Disp: 30 tablet, Rfl: 0 .  blood glucose meter kit and supplies KIT, Dispense based on patient and insurance preference. Use up to four times daily as directed. (FOR ICD-9 250.00, 250.01)., Disp: 1 each, Rfl: 0 .  clonazePAM (KLONOPIN) 0.5 MG tablet, Take 1 tablet (0.5 mg total) by mouth 2 (two) times daily. (Patient not taking: Reported on 07/25/2018), Disp: 20 tablet, Rfl: 0 .  diclofenac (VOLTAREN) 75 MG EC tablet, Take 75 mg by mouth 2 (two) times daily., Disp: , Rfl:  .  DULoxetine (CYMBALTA) 60 MG capsule, , Disp: , Rfl: 0 .  fluorouracil (EFUDEX) 5 % cream, apply topically to affected area twice a day for 2 weeks (EXPECT IRRITATION)., Disp: , Rfl: 0 .  insulin aspart (NOVOLOG) 100 UNIT/ML FlexPen, Inject 0-15 Units into the skin 3 (three) times daily with meals. CBG < 70: Eat or drink something and recheck, CBG 70 - 120: 0 units CBG 121 - 150: 2 units CBG 151 - 200: 3 units CBG 201 - 250: 5  units CBG 251 - 300: 8 units CBG 301 - 350: 11 units CBG 351 - 400: 15 units CBG > 400: call MD., Disp: 15 mL, Rfl: 0 .  Insulin Glargine (LANTUS SOLOSTAR) 100 UNIT/ML Solostar Pen, Inject 25 Units into the skin at bedtime., Disp: 15 mL, Rfl: 0 .  Insulin Pen Needle (PEN NEEDLES 3/16") 31G X 5 MM MISC, Use as per directions up to 4 times., Disp: 100 each, Rfl: 0 .  levothyroxine (SYNTHROID, LEVOTHROID) 50 MCG tablet, Take 50 mcg by mouth daily., Disp: , Rfl: 0 .  losartan (COZAAR) 25 MG tablet, Take 25 mg by mouth daily., Disp: , Rfl:  .  meclizine (ANTIVERT) 25 MG tablet, Take 25 mg by mouth 4 (four) times daily., Disp: , Rfl:  .  MICROLET LANCETS MISC, , Disp: , Rfl: 0 .  pregabalin (LYRICA) 200 MG capsule, Take 200 mg by mouth 2 (two) times daily., Disp: , Rfl:  .  rosuvastatin (CRESTOR) 5 MG tablet, Take 5 mg by mouth daily., Disp: , Rfl:     Review of Systems  Constitutional: Negative for activity change, appetite change, chills, fatigue, fever and unexpected weight change.  HENT: Negative for congestion, rhinorrhea, sinus pressure, sneezing, sore throat and trouble swallowing.   Eyes: Negative for photophobia and visual disturbance.  Respiratory: Positive for cough and shortness of breath. Negative for chest tightness, wheezing and stridor.   Cardiovascular: Negative for chest pain, palpitations and leg swelling.  Gastrointestinal: Negative for abdominal distention, abdominal pain, anal bleeding, blood in stool, constipation, diarrhea, nausea and vomiting.  Genitourinary: Negative for difficulty urinating, dysuria, flank pain and hematuria.  Musculoskeletal: Negative for arthralgias, back pain, gait problem, joint swelling and myalgias.  Skin: Negative for color change, pallor, rash and wound.  Neurological: Negative for dizziness, tremors, weakness and light-headedness.  Hematological: Negative for adenopathy. Does not bruise/bleed easily.  Psychiatric/Behavioral: Negative for  agitation, behavioral problems, confusion, decreased concentration, dysphoric mood and sleep disturbance.       Objective:   Physical Exam  Constitutional: He is oriented to person, place, and time. He appears well-developed and well-nourished. No distress.  HENT:  Head: Normocephalic and atraumatic.  Right Ear: Decreased hearing is noted.  Left Ear: Decreased hearing is noted.  Mouth/Throat: Oropharynx is clear and moist. No oropharyngeal exudate.  Eyes: Pupils are equal, round, and reactive to light. Conjunctivae and EOM are normal. Right eye exhibits no discharge. Left eye  exhibits no discharge. No scleral icterus.  Neck: Normal range of motion. Neck supple. No tracheal deviation present. No thyromegaly present.  Cardiovascular: Normal rate and regular rhythm.  Pulmonary/Chest: Effort normal. No respiratory distress. He has no wheezes.  Abdominal: Soft. Bowel sounds are normal. He exhibits no distension. There is no abdominal tenderness.  Musculoskeletal:        General: No edema.     Right knee: He exhibits no LCL laxity, normal patellar mobility and no MCL laxity. No tenderness found. No medial joint line, no MCL, no LCL and no patellar tendon tenderness noted.     Left knee: Normal.  Lymphadenopathy:    He has no cervical adenopathy.  Neurological: He is alert and oriented to person, place, and time. He exhibits normal muscle tone. Coordination normal.  Skin: Skin is warm and dry. He is not diaphoretic. No erythema. No pallor.  Psychiatric: He has a normal mood and affect. His behavior is normal. Judgment and thought content normal.          Assessment & Plan:    HIV: BIKTARVY and check labs today and in one year  Obesity: Commended him on his weight loss through ketogenic diet.  I think this would be helpful if he could lose more weight.     DM: He is dramatically improved his A1c through weight loss.  He does need to be on something however unless he can lose more  weight over the next few months.  I would recommend he at least start Metformin.  He is due to see PCP soon.  History of bronchitis status post nearly 10 days of antibiotics: Despite his Covid test being negative this certainly could be Covid or other respiratory viral infection that is taking a longer time for him to recover from.

## 2019-08-07 LAB — HIV-1 RNA QUANT-NO REFLEX-BLD
HIV 1 RNA Quant: <20 copies/mL — AB
HIV-1 RNA Quant, Log: <1.3 Log copies/mL — AB

## 2019-08-07 LAB — RPR: RPR Ser Ql: NONREACTIVE

## 2019-08-29 ENCOUNTER — Encounter: Payer: Self-pay | Admitting: *Deleted

## 2019-09-27 ENCOUNTER — Telehealth: Payer: Self-pay | Admitting: Pharmacy Technician

## 2019-09-27 NOTE — Telephone Encounter (Addendum)
[facility] Patient Advocate Encounter   I was successful in securing patient a $7500 grant from [employer] ([employer]) to provide copayment coverage for Biktarvy. This will make the out of pocket cost $0.     I have spoken with the patient.    The billing information is RxBin: [id number] PCN: [id number] Member ID: [id number] Group ID: [id number] Dates of Eligibility: 09/27/2019 through 09/26/2020  [facility] was unable to process correctly despite my reaching out and the patient emailing them. My thought is the claim on [employer] date of service was not changed to match [employer]. The tech team is going to look into the billing and call the patient back with an update. Patient knows to call the office with questions or concerns.  [facility] and will be filled at [facility] to avoid further delay in medication.  Beulah Gandy, CPhT Specialty Pharmacy Patient [facility] Phone: [phone] Fax: [phone] 09/27/2019 2:13 PM

## 2019-10-02 ENCOUNTER — Other Ambulatory Visit: Payer: Self-pay | Admitting: Pharmacist

## 2019-10-02 DIAGNOSIS — B20 Human immunodeficiency virus [HIV] disease: Secondary | ICD-10-CM

## 2019-10-02 MED ORDER — BIKTARVY 50-200-25 MG PO TABS: 1.0000 | ORAL_TABLET | Freq: Every day | ORAL | 11 refills | Status: DC

## 2019-10-02 MED FILL — BIKTARVY 50-200-25 MG TABS: 50-200-25 | 30 days supply | Qty: 30 | Fill #0

## 2019-10-02 NOTE — Progress Notes (Signed)
Sending Biktarvy to [facility].

## 2019-11-02 MED FILL — BIKTARVY 50-200-25 MG TABS: 50-200-25 | 30 days supply | Qty: 30 | Fill #1

## 2019-12-04 MED FILL — BIKTARVY 50-200-25 MG TABS: 50-200-25 | 30 days supply | Qty: 30 | Fill #2

## 2019-12-27 MED FILL — BIKTARVY 50-200-25 MG TABS: 50-200-25 | 30 days supply | Qty: 30 | Fill #3

## 2020-02-27 MED FILL — BIKTARVY 50-200-25 MG TABS: 50-200-25 | 30 days supply | Qty: 30 | Fill #5

## 2020-03-10 ENCOUNTER — Encounter: Payer: Medicare HMO | Admitting: *Deleted

## 2020-03-27 MED FILL — BIKTARVY 50-200-25 MG TABS: 50-200-25 | 30 days supply | Qty: 30 | Fill #6

## 2020-04-14 ENCOUNTER — Other Ambulatory Visit: Payer: Self-pay

## 2020-04-14 ENCOUNTER — Encounter (INDEPENDENT_AMBULATORY_CARE_PROVIDER_SITE_OTHER): Payer: Self-pay | Admitting: *Deleted

## 2020-04-14 ENCOUNTER — Encounter: Payer: Self-pay | Admitting: Infectious Disease

## 2020-04-14 VITALS — BP 149/80 | HR 85 | Temp 98.0°F | Ht 67.5 in | Wt 190.2 lb

## 2020-04-14 DIAGNOSIS — Z006 Encounter for examination for normal comparison and control in clinical research program: Secondary | ICD-10-CM

## 2020-04-14 NOTE — Research (Signed)
Micheal Morales was here for his final visit for The HAILO Study: A Long Term follow-up of Older HIV-Infected Adults in the ACTG, an observational study addressing the issues of aging, HIV infection and Inflammation. He will be seeing Dr. Daiva Eves in December and will need to followup with him yearly with labs since he is not on study anymore. He has been having lot of issues with joint pain in his rt knee and rt hip and is going to have platelet rich plasma injected in both knees next week to see if that helps with the arthritis in his knees. He did have a bout of covid in January this year and the only symptoms he had were loss of smell and taste and fatigue. His smell and taste are still affected. He has not gotten a vaccine for it and does not plan to.

## 2020-04-15 LAB — COMPREHENSIVE METABOLIC PANEL
AG Ratio: 1.6 (calc) (ref 1.0–2.5)
ALT: 28 U/L (ref 9–46)
AST: 23 U/L (ref 10–35)
Albumin: 4.7 g/dL (ref 3.6–5.1)
Alkaline phosphatase (APISO): 66 U/L (ref 35–144)
BUN: 18 mg/dL (ref 7–25)
CO2: 21 mmol/L (ref 20–32)
Calcium: 9.7 mg/dL (ref 8.6–10.3)
Chloride: 100 mmol/L (ref 98–110)
Creat: 1.06 mg/dL (ref 0.70–1.25)
Globulin: 2.9 g/dL (calc) (ref 1.9–3.7)
Glucose, Bld: 230 mg/dL — ABNORMAL HIGH (ref 65–99)
Potassium: 4 mmol/L (ref 3.5–5.3)
Sodium: 135 mmol/L (ref 135–146)
Total Bilirubin: 0.9 mg/dL (ref 0.2–1.2)
Total Protein: 7.6 g/dL (ref 6.1–8.1)

## 2020-04-15 LAB — LIPID PANEL
Cholesterol: 122 mg/dL (ref <200)
HDL: 29 mg/dL — ABNORMAL LOW (ref >=40)
LDL Cholesterol (Calc): 62 mg/dL (calc)
Non-HDL Cholesterol (Calc): 93 mg/dL (calc) (ref <130)
Total CHOL/HDL Ratio: 4.2 (calc) (ref <5.0)
Triglycerides: 293 mg/dL — ABNORMAL HIGH (ref <150)

## 2020-04-15 LAB — CD4/CD8 (T-HELPER/T-SUPPRESSOR CELL)
CD4 % Helper T Cell: 58.3
CD4 Count: 583
CD8 % Suppressor T Cell: 23.5
CD8 T Cell Abs: 235
HIV 1 RNA UltraQuant: <40

## 2020-04-15 LAB — PROTEIN / CREATININE RATIO, URINE
Creatinine, Urine: 91 mg/dL (ref 20–320)
Protein/Creat Ratio: 659 mg/g creat — ABNORMAL HIGH (ref 22–128)
Protein/Creatinine Ratio: 0.659 mg/mg creat — ABNORMAL HIGH (ref 0.022–0.12)
Total Protein, Urine: 60 mg/dL — ABNORMAL HIGH (ref 5–25)

## 2020-04-15 LAB — HEPATITIS C ANTIBODY
Hepatitis C Ab: NONREACTIVE
SIGNAL TO CUT-OFF: 0.01 (ref <1.00)

## 2020-04-29 MED FILL — BIKTARVY 50-200-25 MG TABS: 50-200-25 | 30 days supply | Qty: 30 | Fill #7

## 2020-05-06 ENCOUNTER — Encounter: Payer: Self-pay | Admitting: Infectious Disease

## 2020-06-02 MED FILL — BIKTARVY 50-200-25 MG TABS: 50-200-25 | 30 days supply | Qty: 30 | Fill #8

## 2020-08-12 ENCOUNTER — Ambulatory Visit: Payer: Medicare HMO | Admitting: Infectious Disease

## 2020-08-25 MED FILL — BIKTARVY 50-200-25 MG TABS: 50-200-25 | 30 days supply | Qty: 30 | Fill #9

## 2020-08-28 ENCOUNTER — Other Ambulatory Visit: Payer: Self-pay

## 2020-08-28 ENCOUNTER — Encounter: Payer: Self-pay | Admitting: Infectious Disease

## 2020-08-28 ENCOUNTER — Other Ambulatory Visit (HOSPITAL_COMMUNITY)
Admission: RE | Admit: 2020-08-28 | Discharge: 2020-08-28 | Disposition: A | Discharge status: Home / Self Care | Payer: Medicare HMO | Source: Ambulatory Visit | Attending: Infectious Disease | Admitting: Infectious Disease

## 2020-08-28 ENCOUNTER — Ambulatory Visit: Payer: Medicare HMO | Admitting: Infectious Disease

## 2020-08-28 ENCOUNTER — Other Ambulatory Visit: Payer: Self-pay | Admitting: Infectious Disease

## 2020-08-28 VITALS — BP 166/78 | HR 108 | Wt 185.0 lb

## 2020-08-28 DIAGNOSIS — B20 Human immunodeficiency virus [HIV] disease: Secondary | ICD-10-CM

## 2020-08-28 DIAGNOSIS — G9340 Encephalopathy, unspecified: Secondary | ICD-10-CM | POA: Diagnosis not present

## 2020-08-28 DIAGNOSIS — E1165 Type 2 diabetes mellitus with hyperglycemia: Secondary | ICD-10-CM | POA: Diagnosis not present

## 2020-08-28 DIAGNOSIS — G5793 Unspecified mononeuropathy of bilateral lower limbs: Secondary | ICD-10-CM

## 2020-08-28 HISTORY — DX: Type 2 diabetes mellitus with hyperglycemia: E11.65

## 2020-08-28 MED ORDER — BIKTARVY 50-200-25 MG PO TABS: 1.0000 | ORAL_TABLET | Freq: Every day | ORAL | 11 refills | Status: DC

## 2020-08-28 NOTE — Progress Notes (Signed)
Chief complaint: followup for HIV disease on med  Subjective:    Patient ID: Micheal Morales, male    DOB: 26-Jun-1951, 70 y.o.   MRN: [id number]  HPI  Micheal Morales is a 70 y.o. male with HIV doing very well on his new regimen of Biktarvy.  His has an undetectable viral load and a healthy CD4 count.   His diabetes has become much better controlled through his weight loss.    He is on insulin.  He is having labs done tomorrow with his primary care physician for his physical.  He is concerned about what the cost of labs will be here now that he is no longer in a research study.  I told him he does absolutely need to have some regular labs done with his HIV care in particular he needs a viral load done.  We will check those labs today.  He had COVID-19 infection roughly a year ago and unfortunately  Has not had COVID-19 vaccines at this time.  I have encouraged him to get vaccinated.  .     Past Medical History:  Diagnosis Date  . AKI (acute kidney injury) ([place]) 11/10/2015  . Bilateral hip pain 04/19/2016  . Bilateral shoulder pain 04/19/2016  . Bronchitis, acute 08/01/2019  . Depression   . FUO (fever of unknown origin) 04/19/2016  . Hearing loss 07/25/2018  . HIV infection ([place])   . Hypertension   . 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 Numbness and tingling in hands 05/19/2016  . Right knee pain 04/19/2016    No past surgical history on file.  No family history on file.    Social History   Socioeconomic History  . Marital status: Married    Spouse name: Not on file  . Number of children: Not on file  . Years of education: Not on file  . Highest education level: Not on file  Occupational History  . Not on file  Tobacco Use  . Smoking status: Never Smoker  . Smokeless tobacco: Never Used  Substance and Sexual Activity  . Alcohol use: Yes    Alcohol/week: 1.0 standard drink    Types: 1 Glasses of wine per week    Comment: occ  . Drug use: No  . Sexual  activity: Not Currently  Other Topics Concern  . Not on file  Social History Narrative  . Not on file   Social Determinants of Health   Financial Resource Strain: Not on file  Food Insecurity: Not on file  Transportation Needs: Not on file  Physical Activity: Not on file  Stress: Not on file  Social Connections: Not on file    Allergies  Allergen Reactions  . Cefaclor     REACTION: rash     Current Outpatient Medications:  .  bictegravir-emtricitabine-tenofovir AF (BIKTARVY) 50-200-25 MG TABS tablet, Take 1 tablet by mouth daily., Disp: 30 tablet, Rfl: 11 .  blood glucose meter kit and supplies KIT, Dispense based on patient and insurance preference. Use up to four times daily as directed. (FOR ICD-9 250.00, 250.01)., Disp: 1 each, Rfl: 0 .  clonazePAM (KLONOPIN) 0.5 MG tablet, Take 1 tablet (0.5 mg total) by mouth 2 (two) times daily. (Patient not taking: Reported on 07/25/2018), Disp: 20 tablet, Rfl: 0 .  diclofenac (VOLTAREN) 75 MG EC tablet, Take 75 mg by mouth 2 (two) times daily., Disp: , Rfl:  .  DULoxetine (CYMBALTA) 60 MG capsule, , Disp: , Rfl: 0 .  fluorouracil (EFUDEX) 5 %  cream, apply topically to affected area twice a day for 2 weeks (EXPECT IRRITATION)., Disp: , Rfl: 0 .  levothyroxine (SYNTHROID, LEVOTHROID) 50 MCG tablet, Take 50 mcg by mouth daily., Disp: , Rfl: 0 .  losartan (COZAAR) 25 MG tablet, Take 25 mg by mouth daily., Disp: , Rfl:  .  meclizine (ANTIVERT) 25 MG tablet, Take 25 mg by mouth 4 (four) times daily., Disp: , Rfl:  .  MICROLET LANCETS MISC, , Disp: , Rfl: 0 .  pregabalin (LYRICA) 200 MG capsule, Take 200 mg by mouth 2 (two) times daily., Disp: , Rfl:  .  rosuvastatin (CRESTOR) 5 MG tablet, Take 5 mg by mouth daily., Disp: , Rfl:     Review of Systems  Constitutional: Negative for activity change, appetite change, chills, fatigue, fever and unexpected weight change.  HENT: Negative for congestion, rhinorrhea, sinus pressure, sneezing, sore  throat and trouble swallowing.   Eyes: Negative for photophobia and visual disturbance.  Respiratory: Negative for cough, chest tightness, shortness of breath, wheezing and stridor.   Cardiovascular: Negative for chest pain, palpitations and leg swelling.  Gastrointestinal: Negative for abdominal distention, abdominal pain, anal bleeding, blood in stool, constipation, diarrhea, nausea and vomiting.  Genitourinary: Negative for difficulty urinating, dysuria, flank pain and hematuria.  Musculoskeletal: Negative for arthralgias, back pain, gait problem, joint swelling and myalgias.  Skin: Negative for color change, pallor, rash and wound.  Neurological: Negative for dizziness, tremors, weakness and light-headedness.  Hematological: Negative for adenopathy. Does not bruise/bleed easily.  Psychiatric/Behavioral: Negative for agitation, behavioral problems, confusion, decreased concentration, dysphoric mood and sleep disturbance.       Objective:   Physical Exam Constitutional:      General: He is not in acute distress.    Appearance: He is well-developed. He is not diaphoretic.  HENT:     Head: Normocephalic and atraumatic.     Right Ear: Decreased hearing noted.     Left Ear: Decreased hearing noted.     Mouth/Throat:     Pharynx: No oropharyngeal exudate.  Eyes:     General: No scleral icterus.       Right eye: No discharge.        Left eye: No discharge.     Conjunctiva/sclera: Conjunctivae normal.     Pupils: Pupils are equal, round, and reactive to light.  Neck:     Thyroid: No thyromegaly.     Trachea: No tracheal deviation.  Cardiovascular:     Rate and Rhythm: Normal rate and regular rhythm.  Pulmonary:     Effort: Pulmonary effort is normal. No respiratory distress.     Breath sounds: No wheezing.  Abdominal:     General: Bowel sounds are normal. There is no distension.     Palpations: Abdomen is soft.     Tenderness: There is no abdominal tenderness.  Musculoskeletal:      Cervical back: Normal range of motion and neck supple.     Right knee: No tenderness. No medial joint line, MCL, LCL or patellar tendon tenderness. No LCL laxity or MCL laxity. Normal patellar mobility.     Left knee: Normal.  Lymphadenopathy:     Cervical: No cervical adenopathy.  Skin:    General: Skin is warm and dry.     Coloration: Skin is not pale.     Findings: No erythema.  Neurological:     General: No focal deficit present.     Mental Status: He is alert and oriented to person, place, and  time.     Motor: No abnormal muscle tone.     Coordination: Coordination normal.  Psychiatric:        Mood and Affect: Mood normal.        Behavior: Behavior normal.        Thought Content: Thought content normal.        Judgment: Judgment normal.           Assessment & Plan:    HIV: BIKTARVY and check labs today and in one year  Obesity:  Continue to try to lose weight   DM: We will have A1c checked tomorrow  Prevention: have asked him to get COVID 19 mRNA vaccine

## 2020-08-29 LAB — RPR: RPR Ser Ql: NONREACTIVE

## 2020-08-29 LAB — URINE CYTOLOGY ANCILLARY ONLY
Chlamydia: NEGATIVE
Comment: NEGATIVE
Comment: NORMAL
Neisseria Gonorrhea: NEGATIVE

## 2020-09-05 LAB — HIV-1 RNA QUANT-NO REFLEX-BLD
HIV 1 RNA Quant: <20 Copies/mL
HIV-1 RNA Quant, Log: <1.3 Log cps/mL

## 2020-09-28 MED FILL — BIKTARVY 50-200-25 MG TABS: 50-200-25 | 30 days supply | Qty: 30 | Fill #0

## 2020-09-29 ENCOUNTER — Telehealth: Payer: Self-pay

## 2020-09-29 NOTE — Telephone Encounter (Signed)
[facility] Patient Advocate Encounter   I was successful in securing patient a $7500 grant from [employer] ([employer]) to provide copayment coverage for Biktarvy.  This will make the out of pocket cost $0.00.     I have spoken with the patient.    The billing information is as follows and has been shared with [facility].           Patient knows to call the office with questions or concerns.  Micheal Morales, CPhT Specialty Pharmacy Patient [facility] Phone: [phone] Fax:  [phone]  

## 2020-10-29 MED FILL — BIKTARVY 50-200-25 MG TABS: 50-200-25 | 30 days supply | Qty: 30 | Fill #1

## 2020-11-20 ENCOUNTER — Other Ambulatory Visit (HOSPITAL_COMMUNITY): Payer: Self-pay

## 2020-11-23 ENCOUNTER — Other Ambulatory Visit (HOSPITAL_COMMUNITY): Payer: Self-pay

## 2020-11-23 MED FILL — Bictegravir-Emtricitabine-Tenofovir AF Tab 50-200-25 MG: ORAL | 30 days supply | Qty: 30 | Fill #0 | Status: AC

## 2020-11-25 ENCOUNTER — Other Ambulatory Visit (HOSPITAL_COMMUNITY): Payer: Self-pay

## 2020-11-26 ENCOUNTER — Other Ambulatory Visit (HOSPITAL_COMMUNITY): Payer: Self-pay

## 2020-12-23 ENCOUNTER — Other Ambulatory Visit (HOSPITAL_COMMUNITY): Payer: Self-pay

## 2020-12-23 MED FILL — Bictegravir-Emtricitabine-Tenofovir AF Tab 50-200-25 MG: ORAL | 30 days supply | Qty: 30 | Fill #1 | Status: AC

## 2020-12-30 ENCOUNTER — Other Ambulatory Visit (HOSPITAL_COMMUNITY): Payer: Self-pay

## 2021-01-26 ENCOUNTER — Other Ambulatory Visit (HOSPITAL_COMMUNITY): Payer: Self-pay

## 2021-01-26 MED FILL — Bictegravir-Emtricitabine-Tenofovir AF Tab 50-200-25 MG: ORAL | 30 days supply | Qty: 30 | Fill #2 | Status: AC

## 2021-02-02 ENCOUNTER — Other Ambulatory Visit (HOSPITAL_COMMUNITY): Payer: Self-pay

## 2021-02-26 ENCOUNTER — Other Ambulatory Visit (HOSPITAL_COMMUNITY): Payer: Self-pay

## 2021-02-26 MED FILL — Bictegravir-Emtricitabine-Tenofovir AF Tab 50-200-25 MG: ORAL | 30 days supply | Qty: 30 | Fill #3 | Status: AC

## 2021-03-05 ENCOUNTER — Other Ambulatory Visit (HOSPITAL_COMMUNITY): Payer: Self-pay

## 2021-03-30 ENCOUNTER — Other Ambulatory Visit (HOSPITAL_COMMUNITY): Payer: Self-pay

## 2021-03-30 MED FILL — Bictegravir-Emtricitabine-Tenofovir AF Tab 50-200-25 MG: ORAL | 30 days supply | Qty: 30 | Fill #4 | Status: AC

## 2021-04-07 ENCOUNTER — Other Ambulatory Visit (HOSPITAL_COMMUNITY): Payer: Self-pay

## 2021-04-30 ENCOUNTER — Other Ambulatory Visit (HOSPITAL_COMMUNITY): Payer: Self-pay

## 2021-05-07 ENCOUNTER — Other Ambulatory Visit (HOSPITAL_COMMUNITY): Payer: Self-pay

## 2021-05-07 MED FILL — Bictegravir-Emtricitabine-Tenofovir AF Tab 50-200-25 MG: ORAL | 30 days supply | Qty: 30 | Fill #5 | Status: AC

## 2021-06-02 ENCOUNTER — Other Ambulatory Visit (HOSPITAL_COMMUNITY): Payer: Self-pay

## 2021-06-02 MED FILL — Bictegravir-Emtricitabine-Tenofovir AF Tab 50-200-25 MG: ORAL | 30 days supply | Qty: 30 | Fill #6 | Status: AC

## 2021-06-09 ENCOUNTER — Other Ambulatory Visit (HOSPITAL_COMMUNITY): Payer: Self-pay

## 2021-06-30 ENCOUNTER — Other Ambulatory Visit (HOSPITAL_COMMUNITY): Payer: Self-pay

## 2021-06-30 MED FILL — Bictegravir-Emtricitabine-Tenofovir AF Tab 50-200-25 MG: ORAL | 30 days supply | Qty: 30 | Fill #7 | Status: AC

## 2021-07-01 ENCOUNTER — Other Ambulatory Visit (HOSPITAL_COMMUNITY): Payer: Self-pay

## 2021-07-07 ENCOUNTER — Other Ambulatory Visit (HOSPITAL_COMMUNITY): Payer: Self-pay

## 2021-08-04 ENCOUNTER — Other Ambulatory Visit (HOSPITAL_COMMUNITY): Payer: Self-pay

## 2021-08-04 MED FILL — Bictegravir-Emtricitabine-Tenofovir AF Tab 50-200-25 MG: ORAL | 30 days supply | Qty: 30 | Fill #8 | Status: AC

## 2021-08-11 ENCOUNTER — Other Ambulatory Visit (HOSPITAL_COMMUNITY): Payer: Self-pay

## 2021-09-03 ENCOUNTER — Other Ambulatory Visit: Payer: Self-pay

## 2021-09-03 ENCOUNTER — Other Ambulatory Visit: Payer: Self-pay | Admitting: Infectious Disease

## 2021-09-03 ENCOUNTER — Other Ambulatory Visit (HOSPITAL_COMMUNITY): Payer: Self-pay

## 2021-09-03 DIAGNOSIS — B20 Human immunodeficiency virus [HIV] disease: Secondary | ICD-10-CM

## 2021-09-03 MED ORDER — BICTEGRAVIR-EMTRICITAB-TENOFOV 50-200-25 MG PO TABS
1.0000 | ORAL_TABLET | Freq: Every day | ORAL | 0 refills | Status: DC
  Filled 2021-09-03: qty 30, 30d supply, fill #0

## 2021-09-10 ENCOUNTER — Other Ambulatory Visit (HOSPITAL_COMMUNITY): Payer: Self-pay

## 2021-09-21 ENCOUNTER — Ambulatory Visit: Payer: Medicare HMO | Admitting: Infectious Disease

## 2021-09-29 ENCOUNTER — Other Ambulatory Visit: Payer: Self-pay | Admitting: Infectious Disease

## 2021-09-29 ENCOUNTER — Other Ambulatory Visit (HOSPITAL_COMMUNITY): Payer: Self-pay

## 2021-09-29 DIAGNOSIS — B20 Human immunodeficiency virus [HIV] disease: Secondary | ICD-10-CM

## 2021-09-30 ENCOUNTER — Other Ambulatory Visit (HOSPITAL_COMMUNITY): Payer: Self-pay

## 2021-10-02 ENCOUNTER — Other Ambulatory Visit (HOSPITAL_COMMUNITY): Payer: Self-pay

## 2021-10-05 ENCOUNTER — Other Ambulatory Visit (HOSPITAL_COMMUNITY): Payer: Self-pay

## 2021-10-05 ENCOUNTER — Other Ambulatory Visit: Payer: Self-pay | Admitting: Infectious Disease

## 2021-10-05 DIAGNOSIS — B20 Human immunodeficiency virus [HIV] disease: Secondary | ICD-10-CM

## 2021-10-05 MED ORDER — BIKTARVY 50-200-25 MG PO TABS
1.0000 | ORAL_TABLET | Freq: Every day | ORAL | 0 refills | Status: DC
  Filled 2021-10-05 – 2021-10-08 (×2): qty 30, 30d supply, fill #0

## 2021-10-06 ENCOUNTER — Telehealth: Payer: Self-pay

## 2021-10-06 ENCOUNTER — Other Ambulatory Visit (HOSPITAL_COMMUNITY): Payer: Self-pay

## 2021-10-06 NOTE — Telephone Encounter (Signed)
[facility] Patient Advocate Encounter   I was successful in securing patient a $ 7500.00 grant from [employer] to provide copayment coverage for Biktarvy.  The patient's out of pocket cost will be $5.00 monthly.     I have spoken with the patient.    The billing information is as follows and has been shared with [facility].       Dates of Eligibility: 10/06/21 through 08/22/22  Patient knows to call the office with questions or concerns.  Clearance Coots, CPhT Specialty Pharmacy Patient [facility] for Infectious Disease Phone: [phone] Fax:  [phone]

## 2021-10-08 ENCOUNTER — Other Ambulatory Visit (HOSPITAL_COMMUNITY): Payer: Self-pay

## 2021-10-22 ENCOUNTER — Ambulatory Visit: Payer: Medicare HMO | Admitting: Infectious Disease

## 2021-10-22 ENCOUNTER — Encounter: Payer: Self-pay | Admitting: Infectious Disease

## 2021-10-22 ENCOUNTER — Other Ambulatory Visit: Payer: Self-pay

## 2021-10-22 ENCOUNTER — Other Ambulatory Visit (HOSPITAL_COMMUNITY): Payer: Self-pay

## 2021-10-22 VITALS — BP 132/77 | Wt 177.0 lb

## 2021-10-22 DIAGNOSIS — R42 Dizziness and giddiness: Secondary | ICD-10-CM

## 2021-10-22 DIAGNOSIS — L304 Erythema intertrigo: Secondary | ICD-10-CM | POA: Insufficient documentation

## 2021-10-22 DIAGNOSIS — E1100 Type 2 diabetes mellitus with hyperosmolarity without nonketotic hyperglycemic-hyperosmolar coma (NKHHC): Secondary | ICD-10-CM

## 2021-10-22 DIAGNOSIS — B20 Human immunodeficiency virus [HIV] disease: Secondary | ICD-10-CM

## 2021-10-22 DIAGNOSIS — E861 Hypovolemia: Secondary | ICD-10-CM

## 2021-10-22 DIAGNOSIS — Z96651 Presence of right artificial knee joint: Secondary | ICD-10-CM

## 2021-10-22 DIAGNOSIS — I9589 Other hypotension: Secondary | ICD-10-CM

## 2021-10-22 DIAGNOSIS — E1165 Type 2 diabetes mellitus with hyperglycemia: Secondary | ICD-10-CM | POA: Diagnosis not present

## 2021-10-22 DIAGNOSIS — Z79899 Other long term (current) drug therapy: Secondary | ICD-10-CM

## 2021-10-22 DIAGNOSIS — Z8619 Personal history of other infectious and parasitic diseases: Secondary | ICD-10-CM

## 2021-10-22 DIAGNOSIS — Z96659 Presence of unspecified artificial knee joint: Secondary | ICD-10-CM | POA: Insufficient documentation

## 2021-10-22 DIAGNOSIS — I959 Hypotension, unspecified: Secondary | ICD-10-CM

## 2021-10-22 DIAGNOSIS — G9340 Encephalopathy, unspecified: Secondary | ICD-10-CM

## 2021-10-22 HISTORY — DX: Presence of unspecified artificial knee joint: Z96.659

## 2021-10-22 HISTORY — DX: Dizziness and giddiness: R42

## 2021-10-22 HISTORY — DX: Hypotension, unspecified: I95.9

## 2021-10-22 HISTORY — DX: Erythema intertrigo: L30.4

## 2021-10-22 MED ORDER — BIKTARVY 50-200-25 MG PO TABS
1.0000 | ORAL_TABLET | Freq: Every day | ORAL | 11 refills | Status: DC
  Filled 2021-10-22 – 2021-10-30 (×2): qty 30, 30d supply, fill #0
  Filled 2021-11-27: qty 30, 30d supply, fill #1
  Filled 2021-12-25: qty 30, 30d supply, fill #2
  Filled 2022-01-21: qty 30, 30d supply, fill #3
  Filled 2022-02-18: qty 30, 30d supply, fill #4
  Filled 2022-03-16: qty 30, 30d supply, fill #5
  Filled 2022-04-15: qty 30, 30d supply, fill #6
  Filled 2022-05-14: qty 30, 30d supply, fill #7
  Filled 2022-06-14: qty 30, 30d supply, fill #8
  Filled 2022-07-12: qty 30, 30d supply, fill #9
  Filled 2022-08-10: qty 30, 30d supply, fill #10
  Filled 2022-09-06 – 2022-09-15 (×2): qty 30, 30d supply, fill #11

## 2021-10-22 MED ORDER — FLUCONAZOLE 100 MG PO TABS: 100.0000 mg | ORAL_TABLET | Freq: Every day | ORAL | 1 refills | Status: AC

## 2021-10-22 NOTE — Progress Notes (Signed)
?Chief complaint: Intense intertrigo also here to follow-up for his HIV disease on Biktarvy ? ?Subjective:  ? ? Patient ID: Micheal Morales, male    DOB: 11-01-1950, 71 y.o.   MRN: [id number] ? ?HPI ? ?Micheal Morales is a 71 y.o. male with HIV doing very well on his new regimen of Biktarvy. ? ?He has had comorbid hypertension hyper lipidemia and diabetes mellitus. ? ?He is on Jardiance and insulin. ? ?He had osteoarthritis and underwent total knee arthroplasty roughly 6 weeks ago. ? ?He says that his postoperative course was complicated by hypotension which required the patient to be kept in the hospital longer than the initial planned overnight stay. ? ?He is also lost a fair amount of weight postoperatively but is also trying to keep it off intentionally. ? ? ? 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 ? ?Is complaining of jock itch which he is worried will not respond to over-the-counter Lotrimin which began few days ago. ? ? ? ?. ? ? ? ? ?Past Medical History:  ?Diagnosis Date  ? AKI (acute kidney injury) ([place]) 11/10/2015  ? Bilateral hip pain 04/19/2016  ? Bilateral shoulder pain 04/19/2016  ? Bronchitis, acute 08/01/2019  ? Depression   ? FUO (fever of unknown origin) 04/19/2016  ? Hearing loss 07/25/2018  ? HIV infection ([place])   ? Hypertension   ? 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 Numbness and tingling in hands 05/19/2016  ? Right knee pain 04/19/2016  ? Type 2 diabetes mellitus with hyperglycemia ([place]) 08/28/2020  ? ? ?No past surgical history on file. ? ?No family history on file. ? ?  ?Social History  ? ?Socioeconomic History  ? Marital status: Married  ?  Spouse name: Not on file  ? Number of children: Not on file  ? Years of education: Not on file  ? Highest education level: Not on file  ?Occupational History  ? Not on file  ?Tobacco Use  ? Smoking status: Never  ? Smokeless tobacco: Never  ?Substance and Sexual Activity  ? Alcohol use: Yes  ?  Alcohol/week: 1.0 standard drink   ?  Types: 1 Glasses of wine per week  ?  Comment: occ  ? Drug use: No  ? Sexual activity: Not Currently  ?Other Topics Concern  ? Not on file  ?Social History Narrative  ? Not on file  ? ?Social Determinants of Health  ? ?Financial Resource Strain: Not on file  ?Food Insecurity: Not on file  ?Transportation Needs: Not on file  ?Physical Activity: Not on file  ?Stress: Not on file  ?Social Connections: Not on file  ? ? ?Allergies  ?Allergen Reactions  ? Cefaclor   ?  REACTION: rash  ? ? ? ?Current Outpatient Medications:  ?  bictegravir-emtricitabine-tenofovir AF (BIKTARVY) 50-200-25 MG TABS tablet, TAKE 1 TABLET BY MOUTH DAILY., Disp: 30 tablet, Rfl: 0 ?  blood glucose meter kit and supplies KIT, Dispense based on patient and insurance preference. Use up to four times daily as directed. (FOR ICD-9 250.00, 250.01)., Disp: 1 each, Rfl: 0 ?  clonazePAM (KLONOPIN) 0.5 MG tablet, Take 1 tablet (0.5 mg total) by mouth 2 (two) times daily. (Patient not taking: No sig reported), Disp: 20 tablet, Rfl: 0 ?  diclofenac (VOLTAREN) 75 MG EC tablet, Take 75 mg by mouth 2 (two) times daily., Disp: , Rfl:  ?  DULoxetine (CYMBALTA) 60 MG capsule, , Disp: , Rfl: 0 ?  fluorouracil (EFUDEX) 5 %  cream, apply topically to affected area twice a day for 2 weeks (EXPECT IRRITATION)., Disp: , Rfl: 0 ?  levothyroxine (SYNTHROID, LEVOTHROID) 50 MCG tablet, Take 50 mcg by mouth daily., Disp: , Rfl: 0 ?  losartan (COZAAR) 25 MG tablet, Take 25 mg by mouth daily., Disp: , Rfl:  ?  meclizine (ANTIVERT) 25 MG tablet, Take 25 mg by mouth 4 (four) times daily., Disp: , Rfl:  ?  MICROLET LANCETS MISC, , Disp: , Rfl: 0 ?  pregabalin (LYRICA) 200 MG capsule, Take 200 mg by mouth 2 (two) times daily., Disp: , Rfl:  ?  rosuvastatin (CRESTOR) 5 MG tablet, Take 20 mg by mouth daily., Disp: , Rfl:  ? ? ? ?Review of Systems  ?Constitutional:  Negative for activity change, appetite change, chills, diaphoresis, fatigue, fever and unexpected weight change.   ?HENT:  Negative for congestion, rhinorrhea, sinus pressure, sneezing, sore throat and trouble swallowing.   ?Eyes:  Negative for photophobia and visual disturbance.  ?Respiratory:  Negative for cough, chest tightness, shortness of breath, wheezing and stridor.   ?Cardiovascular:  Negative for chest pain, palpitations and leg swelling.  ?Gastrointestinal:  Negative for abdominal distention, abdominal pain, anal bleeding, blood in stool, constipation, diarrhea, nausea and vomiting.  ?Genitourinary:  Negative for difficulty urinating, dysuria, flank pain and hematuria.  ?Musculoskeletal:  Negative for arthralgias, back pain, gait problem, joint swelling and myalgias.  ?Skin:  Negative for color change, pallor, rash and wound.  ?Neurological:  Positive for dizziness. Negative for tremors, weakness and light-headedness.  ?Hematological:  Negative for adenopathy. Does not bruise/bleed easily.  ?Psychiatric/Behavioral:  Negative for agitation, behavioral problems, confusion, decreased concentration, dysphoric mood and sleep disturbance.   ? ?   ?Objective:  ? Physical Exam ?Constitutional:   ?   Appearance: He is well-developed.  ?HENT:  ?   Head: Normocephalic and atraumatic.  ?Eyes:  ?   Conjunctiva/sclera: Conjunctivae normal.  ?Cardiovascular:  ?   Rate and Rhythm: Normal rate and regular rhythm.  ?Pulmonary:  ?   Effort: Pulmonary effort is normal. No respiratory distress.  ?   Breath sounds: No wheezing.  ?Abdominal:  ?   General: There is no distension.  ?   Palpations: Abdomen is soft.  ?Musculoskeletal:     ?   General: No tenderness. Normal range of motion.  ?   Cervical back: Normal range of motion and neck supple.  ?Skin: ?   General: Skin is warm and dry.  ?   Coloration: Skin is not pale.  ?   Findings: No erythema or rash.  ?Neurological:  ?   General: No focal deficit present.  ?   Mental Status: He is alert and oriented to person, place, and time.  ?Psychiatric:     ?   Mood and Affect: Mood normal.      ?   Behavior: Behavior normal.     ?   Thought Content: Thought content normal.     ?   Judgment: Judgment normal.  ? ? ?Knee incision is clean and dry and intact. ? ?We did orthostatic vital signs and he did drop with systolics in the 938H to 829H on standing and was dizzy with standing. ? ?He was also dizzy when I lay him down on the exam table to get him ready for blood pressure check while lying down. ? ? ? ? ?   ?Assessment & Plan:  ? ? ?HIV disease: ? ?I am checking a viral load CD4 count  CBC CMP ? ?Continue his prescription for Biktarvy which [facility] ? ?He can return to clinic in 1 year with regards to HIV. ? ?Dizziness: I had concerns that this was due to his blood pressure dropping and it did indeed drop 20 points was in the 130s. ? ?Dizziness resolved within a minute of standing. ? ?At home he does not get up immediately as we had him do in the clinic we checked his orthostatic vitals. ? ?I think he likely is suffering from vertigo and an inner ear issue and would benefit from physical therapy. ? ?I made referral to physical therapy for this. ? ?Hypertension he remains on losartan and verapamil. ? ?Diabetes mellitus: He had A1c checked apparently around the time of surgery is following up with PCP soon. ? ?Intertrigo: I am prescribing fluconazole for 14 days. ? ? ? ?

## 2021-10-26 LAB — CBC WITH DIFFERENTIAL/PLATELET
Absolute Monocytes: 378 cells/uL (ref 200–950)
Basophils Absolute: 38 cells/uL (ref 0–200)
Basophils Relative: 0.7 %
Eosinophils Absolute: 130 cells/uL (ref 15–500)
Eosinophils Relative: 2.4 %
HCT: 48.4 % (ref 38.5–50.0)
Hemoglobin: 16.3 g/dL (ref 13.2–17.1)
Lymphs Abs: 1091 cells/uL (ref 850–3900)
MCH: 31.3 pg (ref 27.0–33.0)
MCHC: 33.7 g/dL (ref 32.0–36.0)
MCV: 92.9 fL (ref 80.0–100.0)
MPV: 11.4 fL (ref 7.5–12.5)
Monocytes Relative: 7 %
Neutro Abs: 3764 cells/uL (ref 1500–7800)
Neutrophils Relative %: 69.7 %
Platelets: 130 10*3/uL — ABNORMAL LOW (ref 140–400)
RBC: 5.21 10*6/uL (ref 4.20–5.80)
RDW: 13 % (ref 11.0–15.0)
Total Lymphocyte: 20.2 %
WBC: 5.4 10*3/uL (ref 3.8–10.8)

## 2021-10-26 LAB — COMPLETE METABOLIC PANEL WITH GFR
AG Ratio: 1.8 (calc) (ref 1.0–2.5)
ALT: 19 U/L (ref 9–46)
AST: 14 U/L (ref 10–35)
Albumin: 4.6 g/dL (ref 3.6–5.1)
Alkaline phosphatase (APISO): 66 U/L (ref 35–144)
BUN: 15 mg/dL (ref 7–25)
CO2: 26 mmol/L (ref 20–32)
Calcium: 9.7 mg/dL (ref 8.6–10.3)
Chloride: 104 mmol/L (ref 98–110)
Creat: 1.18 mg/dL (ref 0.70–1.35)
Globulin: 2.5 g/dL (calc) (ref 1.9–3.7)
Glucose, Bld: 183 mg/dL — ABNORMAL HIGH (ref 65–99)
Potassium: 4 mmol/L (ref 3.5–5.3)
Sodium: 139 mmol/L (ref 135–146)
Total Bilirubin: 0.6 mg/dL (ref 0.2–1.2)
Total Protein: 7.1 g/dL (ref 6.1–8.1)
eGFR: 67 mL/min/{1.73_m2} (ref >=60)

## 2021-10-26 LAB — HEPATITIS B DNA, ULTRAQUANTITATIVE, PCR
Hepatitis B DNA (Calc): <1 Log IU/mL
Hepatitis B DNA: <10 IU/mL

## 2021-10-26 LAB — LIPID PANEL
Cholesterol: 110 mg/dL (ref <200)
HDL: 30 mg/dL — ABNORMAL LOW (ref >=40)
LDL Cholesterol (Calc): 52 mg/dL (calc)
Non-HDL Cholesterol (Calc): 80 mg/dL (calc) (ref <130)
Total CHOL/HDL Ratio: 3.7 (calc) (ref <5.0)
Triglycerides: 201 mg/dL — ABNORMAL HIGH (ref <150)

## 2021-10-26 LAB — C. TRACHOMATIS/N. GONORRHOEAE RNA
C. trachomatis RNA, TMA: NOT DETECTED
N. gonorrhoeae RNA, TMA: NOT DETECTED

## 2021-10-26 LAB — HEPATITIS B SURFACE ANTIGEN: Hepatitis B Surface Ag: NONREACTIVE

## 2021-10-26 LAB — T-HELPER CELLS (CD4) COUNT (NOT AT ARMC)
Absolute CD4: 632 cells/uL (ref 490–1740)
CD4 T Helper %: 56 % (ref 30–61)
Total lymphocyte count: 1125 cells/uL (ref 850–3900)

## 2021-10-26 LAB — HIV-1 RNA QUANT-NO REFLEX-BLD
HIV 1 RNA Quant: <20 Copies/mL — ABNORMAL HIGH
HIV-1 RNA Quant, Log: <1.3 Log cps/mL — ABNORMAL HIGH

## 2021-10-26 LAB — RPR: RPR Ser Ql: NONREACTIVE

## 2021-10-30 ENCOUNTER — Other Ambulatory Visit (HOSPITAL_COMMUNITY): Payer: Self-pay

## 2021-11-02 ENCOUNTER — Other Ambulatory Visit (HOSPITAL_COMMUNITY): Payer: Self-pay

## 2021-11-05 ENCOUNTER — Other Ambulatory Visit (HOSPITAL_COMMUNITY): Payer: Self-pay

## 2021-11-26 ENCOUNTER — Other Ambulatory Visit (HOSPITAL_COMMUNITY): Payer: Self-pay

## 2021-11-27 ENCOUNTER — Other Ambulatory Visit (HOSPITAL_COMMUNITY): Payer: Self-pay

## 2021-12-03 ENCOUNTER — Other Ambulatory Visit (HOSPITAL_COMMUNITY): Payer: Self-pay

## 2021-12-25 ENCOUNTER — Other Ambulatory Visit (HOSPITAL_COMMUNITY): Payer: Self-pay

## 2021-12-30 ENCOUNTER — Other Ambulatory Visit (HOSPITAL_COMMUNITY): Payer: Self-pay

## 2022-01-21 ENCOUNTER — Other Ambulatory Visit (HOSPITAL_COMMUNITY): Payer: Self-pay

## 2022-01-27 ENCOUNTER — Other Ambulatory Visit (HOSPITAL_COMMUNITY): Payer: Self-pay

## 2022-02-18 ENCOUNTER — Other Ambulatory Visit (HOSPITAL_COMMUNITY): Payer: Self-pay

## 2022-02-25 ENCOUNTER — Other Ambulatory Visit (HOSPITAL_COMMUNITY): Payer: Self-pay

## 2022-03-16 ENCOUNTER — Other Ambulatory Visit (HOSPITAL_COMMUNITY): Payer: Self-pay

## 2022-03-23 ENCOUNTER — Other Ambulatory Visit (HOSPITAL_COMMUNITY): Payer: Self-pay

## 2022-03-25 ENCOUNTER — Other Ambulatory Visit (HOSPITAL_COMMUNITY): Payer: Self-pay

## 2022-04-15 ENCOUNTER — Other Ambulatory Visit (HOSPITAL_COMMUNITY): Payer: Self-pay

## 2022-04-22 ENCOUNTER — Other Ambulatory Visit (HOSPITAL_COMMUNITY): Payer: Self-pay

## 2022-05-14 ENCOUNTER — Other Ambulatory Visit (HOSPITAL_COMMUNITY): Payer: Self-pay

## 2022-05-18 ENCOUNTER — Other Ambulatory Visit (HOSPITAL_COMMUNITY): Payer: Self-pay

## 2022-05-20 ENCOUNTER — Other Ambulatory Visit (HOSPITAL_COMMUNITY): Payer: Self-pay

## 2022-06-14 ENCOUNTER — Other Ambulatory Visit (HOSPITAL_COMMUNITY): Payer: Self-pay

## 2022-06-15 ENCOUNTER — Other Ambulatory Visit (HOSPITAL_COMMUNITY): Payer: Self-pay

## 2022-06-16 ENCOUNTER — Other Ambulatory Visit (HOSPITAL_COMMUNITY): Payer: Self-pay

## 2022-06-18 ENCOUNTER — Other Ambulatory Visit (HOSPITAL_COMMUNITY): Payer: Self-pay

## 2022-07-08 ENCOUNTER — Other Ambulatory Visit (HOSPITAL_COMMUNITY): Payer: Self-pay

## 2022-07-12 ENCOUNTER — Other Ambulatory Visit (HOSPITAL_COMMUNITY): Payer: Self-pay

## 2022-07-20 ENCOUNTER — Other Ambulatory Visit (HOSPITAL_COMMUNITY): Payer: Self-pay

## 2022-08-10 ENCOUNTER — Other Ambulatory Visit (HOSPITAL_COMMUNITY): Payer: Self-pay

## 2022-08-17 ENCOUNTER — Other Ambulatory Visit (HOSPITAL_COMMUNITY): Payer: Self-pay

## 2022-09-06 ENCOUNTER — Other Ambulatory Visit (HOSPITAL_COMMUNITY): Payer: Self-pay

## 2022-09-08 ENCOUNTER — Other Ambulatory Visit (HOSPITAL_COMMUNITY): Payer: Self-pay

## 2022-09-09 ENCOUNTER — Other Ambulatory Visit (HOSPITAL_COMMUNITY): Payer: Self-pay

## 2022-09-09 ENCOUNTER — Other Ambulatory Visit: Payer: Self-pay

## 2022-09-14 ENCOUNTER — Other Ambulatory Visit (HOSPITAL_COMMUNITY): Payer: Self-pay

## 2022-09-15 ENCOUNTER — Other Ambulatory Visit: Payer: Self-pay

## 2022-09-15 ENCOUNTER — Other Ambulatory Visit (HOSPITAL_COMMUNITY): Payer: Self-pay

## 2022-09-15 ENCOUNTER — Telehealth: Payer: Self-pay

## 2022-09-15 NOTE — Telephone Encounter (Signed)
[facility] Patient Advocate Encounter   I was successful in securing patient a $ 10,000.00 grant from [employer] to provide copayment coverage for Biktarvy.  The patient's out of pocket cost will be $10.00 monthly.     I have spoken with the patient.    The billing information is as follows and has been shared with [facility].           Dates of Eligibility: 09/15/22 through 08/23/23  Patient knows to call the office with questions or concerns.  Ileene Patrick, [facility] Patient [facility] for Infectious Disease Phone: [phone] Fax:  [phone]

## 2022-09-29 ENCOUNTER — Other Ambulatory Visit (HOSPITAL_COMMUNITY): Payer: Self-pay

## 2022-10-05 ENCOUNTER — Other Ambulatory Visit (HOSPITAL_COMMUNITY): Payer: Self-pay

## 2022-10-05 ENCOUNTER — Other Ambulatory Visit: Payer: Self-pay

## 2022-10-05 ENCOUNTER — Other Ambulatory Visit: Payer: Self-pay | Admitting: Infectious Disease

## 2022-10-05 DIAGNOSIS — B20 Human immunodeficiency virus [HIV] disease: Secondary | ICD-10-CM

## 2022-10-05 DIAGNOSIS — Z79899 Other long term (current) drug therapy: Secondary | ICD-10-CM

## 2022-10-05 MED ORDER — BIKTARVY 50-200-25 MG PO TABS
1.0000 | ORAL_TABLET | Freq: Every day | ORAL | 0 refills | Status: DC
  Filled 2022-10-05: qty 30, 30d supply, fill #0

## 2022-10-08 ENCOUNTER — Other Ambulatory Visit: Payer: Self-pay

## 2022-11-02 ENCOUNTER — Other Ambulatory Visit: Payer: Self-pay

## 2022-11-02 ENCOUNTER — Other Ambulatory Visit: Payer: Self-pay | Admitting: Infectious Disease

## 2022-11-02 ENCOUNTER — Other Ambulatory Visit (HOSPITAL_COMMUNITY): Payer: Self-pay

## 2022-11-02 DIAGNOSIS — Z79899 Other long term (current) drug therapy: Secondary | ICD-10-CM

## 2022-11-02 DIAGNOSIS — B20 Human immunodeficiency virus [HIV] disease: Secondary | ICD-10-CM

## 2022-11-02 MED ORDER — BIKTARVY 50-200-25 MG PO TABS
1.0000 | ORAL_TABLET | Freq: Every day | ORAL | 0 refills | Status: DC
  Filled 2022-11-02: qty 30, 30d supply, fill #0

## 2022-11-05 ENCOUNTER — Other Ambulatory Visit (HOSPITAL_COMMUNITY): Payer: Self-pay

## 2022-11-08 ENCOUNTER — Other Ambulatory Visit: Payer: Self-pay

## 2022-11-09 ENCOUNTER — Other Ambulatory Visit (HOSPITAL_COMMUNITY): Payer: Self-pay

## 2022-11-09 ENCOUNTER — Other Ambulatory Visit: Payer: Self-pay

## 2022-11-25 ENCOUNTER — Other Ambulatory Visit: Payer: Self-pay

## 2022-11-25 ENCOUNTER — Ambulatory Visit: Payer: Medicare HMO | Admitting: Infectious Disease

## 2022-11-25 ENCOUNTER — Encounter: Payer: Self-pay | Admitting: Infectious Disease

## 2022-11-25 ENCOUNTER — Other Ambulatory Visit (HOSPITAL_COMMUNITY): Payer: Self-pay

## 2022-11-25 ENCOUNTER — Other Ambulatory Visit (HOSPITAL_COMMUNITY)
Admission: RE | Admit: 2022-11-25 | Discharge: 2022-11-25 | Disposition: A | Discharge status: Home / Self Care | Payer: Medicare HMO | Source: Ambulatory Visit | Attending: Infectious Disease | Admitting: Infectious Disease

## 2022-11-25 VITALS — BP 131/63 | HR 71 | Temp 97.5°F | Wt 179.0 lb

## 2022-11-25 DIAGNOSIS — Z23 Encounter for immunization: Secondary | ICD-10-CM

## 2022-11-25 DIAGNOSIS — B20 Human immunodeficiency virus [HIV] disease: Secondary | ICD-10-CM | POA: Diagnosis present

## 2022-11-25 DIAGNOSIS — E1165 Type 2 diabetes mellitus with hyperglycemia: Secondary | ICD-10-CM | POA: Diagnosis not present

## 2022-11-25 DIAGNOSIS — Z79899 Other long term (current) drug therapy: Secondary | ICD-10-CM | POA: Diagnosis not present

## 2022-11-25 DIAGNOSIS — Z7185 Encounter for immunization safety counseling: Secondary | ICD-10-CM

## 2022-11-25 DIAGNOSIS — Z794 Long term (current) use of insulin: Secondary | ICD-10-CM | POA: Diagnosis not present

## 2022-11-25 HISTORY — DX: Human immunodeficiency virus (HIV) disease: B20

## 2022-11-25 HISTORY — DX: Encounter for immunization safety counseling: Z71.85

## 2022-11-25 MED ORDER — BIKTARVY 50-200-25 MG PO TABS
1.0000 | ORAL_TABLET | Freq: Every day | ORAL | 11 refills | Status: DC
Start: 2022-11-25 — End: 2023-11-16
  Filled 2022-11-25 – 2022-12-02 (×2): qty 30, 30d supply, fill #0
  Filled 2023-01-05: qty 30, 30d supply, fill #1
  Filled 2023-01-28: qty 30, 30d supply, fill #2
  Filled 2023-02-25: qty 30, 30d supply, fill #3
  Filled 2023-03-30: qty 30, 30d supply, fill #4
  Filled 2023-04-28: qty 30, 30d supply, fill #5
  Filled 2023-05-27: qty 30, 30d supply, fill #6
  Filled 2023-06-22: qty 30, 30d supply, fill #7
  Filled 2023-07-20 – 2023-07-26 (×2): qty 30, 30d supply, fill #8
  Filled 2023-08-16: qty 30, 30d supply, fill #9
  Filled 2023-09-15: qty 30, 30d supply, fill #10
  Filled 2023-10-19: qty 30, 30d supply, fill #11

## 2022-11-25 NOTE — Progress Notes (Signed)
Chief complaint: Follow-up for HIV disease on medications Subjective:    Patient ID: Micheal Morales, male    DOB: Mar 02, 1951, 72 y.o.   MRN: [id number]  HPI  Micheal Morales is a 72 y.o. male with HIV doing very well on his new regimen of Biktarvy.  He has had comorbid hypertension hyperlipidemia and diabetes mellitus.  He is on Jardiance and insulin.       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  No family history on file.    Social History   Socioeconomic History   Marital status: Married    Spouse name: Not on file   Number of children: Not on file   Years of education: Not on file   Highest education level: Not on file  Occupational History   Not on file  Tobacco Use   Smoking status: Never   Smokeless tobacco: Never  Substance and Sexual Activity   Alcohol use: Yes    Alcohol/week: 1.0 standard drink of alcohol    Types: 1 Glasses of wine per week    Comment: occ   Drug use: No   Sexual activity: Not Currently  Other Topics Concern   Not on file  Social History Narrative   Not on file   Social Determinants of Health   Financial Resource Strain: Not on file  Food Insecurity: Not on file  Transportation Needs: Not on file  Physical Activity: Not on file  Stress: Not on file  Social Connections: Not on file    Allergies  Allergen  Reactions   Cefaclor     REACTION: rash     Current Outpatient Medications:    bictegravir-emtricitabine-tenofovir AF (BIKTARVY) 50-200-25 MG TABS tablet, TAKE 1 TABLET BY MOUTH DAILY., Disp: 30 tablet, Rfl: 0   blood glucose meter kit and supplies KIT, Dispense based on patient and insurance preference. Use up to four times daily as directed. (FOR ICD-9 250.00, 250.01)., Disp: 1 each, Rfl: 0   clonazePAM (KLONOPIN) 0.5 MG tablet, Take 1 tablet (0.5 mg total) by mouth 2 (two) times daily. (Patient not taking: Reported on 07/25/2018), Disp: 20 tablet, Rfl: 0   clotrimazole-betamethasone (LOTRISONE) cream, Apply topically., Disp: , Rfl:    diclofenac (VOLTAREN) 75 MG EC tablet, Take 75 mg by mouth 2 (two) times daily., Disp: , Rfl:    DULoxetine (CYMBALTA) 60 MG capsule, , Disp: , Rfl: 0   fluconazole (DIFLUCAN) 100 MG tablet, Take 1 tablet (100 mg total) by mouth daily., Disp: 14 tablet, Rfl: 1   fluorouracil (EFUDEX) 5 % cream, apply topically to affected area twice a day for 2 weeks (EXPECT IRRITATION)., Disp: , Rfl: 0   JARDIANCE 25 MG TABS tablet, Take 25 mg by mouth daily., Disp: , Rfl:    LEVEMIR FLEXTOUCH 100 UNIT/ML FlexTouch Pen, Inject into the skin., Disp: ,  Rfl:    levothyroxine (SYNTHROID, LEVOTHROID) 50 MCG tablet, Take 50 mcg by mouth daily., Disp: , Rfl: 0   losartan (COZAAR) 25 MG tablet, Take 25 mg by mouth daily., Disp: , Rfl:    meclizine (ANTIVERT) 25 MG tablet, Take 25 mg by mouth 4 (four) times daily., Disp: , Rfl:    MICROLET LANCETS MISC, , Disp: , Rfl: 0   pregabalin (LYRICA) 200 MG capsule, Take 200 mg by mouth 2 (two) times daily., Disp: , Rfl:    rosuvastatin (CRESTOR) 5 MG tablet, Take 20 mg by mouth daily., Disp: , Rfl:    verapamil (CALAN-SR) 120 MG CR tablet, Take 120 mg by mouth daily., Disp: , Rfl:     Review of Systems  Constitutional:  Negative for activity change, appetite change, chills, diaphoresis, fatigue, fever and unexpected weight change.  HENT:   Negative for congestion, rhinorrhea, sinus pressure, sneezing, sore throat and trouble swallowing.   Eyes:  Negative for photophobia and visual disturbance.  Respiratory:  Negative for cough, chest tightness, shortness of breath, wheezing and stridor.   Cardiovascular:  Negative for chest pain, palpitations and leg swelling.  Gastrointestinal:  Negative for abdominal distention, abdominal pain, anal bleeding, blood in stool, constipation, diarrhea, nausea and vomiting.  Genitourinary:  Negative for difficulty urinating, dysuria, flank pain and hematuria.  Musculoskeletal:  Negative for arthralgias, back pain, gait problem, joint swelling and myalgias.  Skin:  Negative for color change, pallor, rash and wound.  Neurological:  Negative for dizziness, tremors, weakness and light-headedness.  Hematological:  Negative for adenopathy. Does not bruise/bleed easily.  Psychiatric/Behavioral:  Negative for agitation, behavioral problems, confusion, decreased concentration, dysphoric mood and sleep disturbance.        Objective:   Physical Exam Constitutional:      Appearance: He is well-developed.  HENT:     Head: Normocephalic and atraumatic.  Eyes:     Conjunctiva/sclera: Conjunctivae normal.  Cardiovascular:     Rate and Rhythm: Normal rate and regular rhythm.  Pulmonary:     Effort: Pulmonary effort is normal. No respiratory distress.     Breath sounds: No wheezing.  Abdominal:     General: There is no distension.     Palpations: Abdomen is soft.  Musculoskeletal:        General: No tenderness. Normal range of motion.     Cervical back: Normal range of motion and neck supple.  Skin:    General: Skin is warm and dry.     Coloration: Skin is not pale.     Findings: No erythema or rash.  Neurological:     Mental Status: He is alert and oriented to person, place, and time.  Psychiatric:        Mood and Affect: Mood normal.        Behavior: Behavior normal.        Thought Content:  Thought content normal.        Judgment: Judgment normal.            Assessment & Plan:   HIV disease:  I will add order HIV viral load CD4 count CBC with differential CMP, RPR GC and chlamydia and I will continue  [employer], prescription  Hyperlipidemia: He will continue on his Crestor  Hypertension he remains on losartan and verapamil which will be continued   diabetes mellitus: Following with PCP on medications as listed above ? Would he benefit from injections of ozempic  Diabetes mellitus: He is on Jardiance  and insulin   Vaccine counseling: recommended updated COVID 19 vaccine and tetatanus and he accepted the latter

## 2022-11-25 NOTE — Addendum Note (Signed)
Addended by: Tomi Bamberger on: 11/25/2022 11:32 AM   Modules accepted: Orders

## 2022-11-26 LAB — T-HELPER CELLS (CD4) COUNT (NOT AT ARMC)
CD4 % Helper T Cell: 45 % (ref 33–65)
CD4 T Cell Abs: 506 /uL (ref 400–1790)

## 2022-11-26 LAB — URINE CYTOLOGY ANCILLARY ONLY
Chlamydia: NEGATIVE
Comment: NEGATIVE
Comment: NORMAL
Neisseria Gonorrhea: NEGATIVE

## 2022-11-27 LAB — CBC WITH DIFFERENTIAL/PLATELET
Absolute Monocytes: 518 cells/uL (ref 200–950)
Basophils Absolute: 49 cells/uL (ref 0–200)
Basophils Relative: 0.6 %
Eosinophils Absolute: 113 cells/uL (ref 15–500)
Eosinophils Relative: 1.4 %
HCT: 52.8 % — ABNORMAL HIGH (ref 38.5–50.0)
Hemoglobin: 17.8 g/dL — ABNORMAL HIGH (ref 13.2–17.1)
Lymphs Abs: 1150.2 cells/uL (ref 850–3900)
MCH: 30.4 pg (ref 27.0–33.0)
MCHC: 33.7 g/dL (ref 32.0–36.0)
MCV: 90.1 fL (ref 80.0–100.0)
MPV: 11.7 fL (ref 7.5–12.5)
Monocytes Relative: 6.4 %
Neutro Abs: 6269 cells/uL (ref 1500–7800)
Neutrophils Relative %: 77.4 %
Platelets: 137 10*3/uL — ABNORMAL LOW (ref 140–400)
RBC: 5.86 10*6/uL — ABNORMAL HIGH (ref 4.20–5.80)
RDW: 12.5 % (ref 11.0–15.0)
Total Lymphocyte: 14.2 %
WBC: 8.1 10*3/uL (ref 3.8–10.8)

## 2022-11-27 LAB — COMPLETE METABOLIC PANEL WITH GFR
AG Ratio: 1.6 (calc) (ref 1.0–2.5)
ALT: 26 U/L (ref 9–46)
AST: 20 U/L (ref 10–35)
Albumin: 4.5 g/dL (ref 3.6–5.1)
Alkaline phosphatase (APISO): 57 U/L (ref 35–144)
BUN: 17 mg/dL (ref 7–25)
CO2: 22 mmol/L (ref 20–32)
Calcium: 9.7 mg/dL (ref 8.6–10.3)
Chloride: 103 mmol/L (ref 98–110)
Creat: 1.19 mg/dL (ref 0.70–1.28)
Globulin: 2.8 g/dL (calc) (ref 1.9–3.7)
Glucose, Bld: 228 mg/dL — ABNORMAL HIGH (ref 65–99)
Potassium: 4.1 mmol/L (ref 3.5–5.3)
Sodium: 137 mmol/L (ref 135–146)
Total Bilirubin: 1.1 mg/dL (ref 0.2–1.2)
Total Protein: 7.3 g/dL (ref 6.1–8.1)
eGFR: 66 mL/min/{1.73_m2} (ref >=60)

## 2022-11-27 LAB — LIPID PANEL
Cholesterol: 120 mg/dL (ref <200)
HDL: 30 mg/dL — ABNORMAL LOW (ref >=40)
LDL Cholesterol (Calc): 67 mg/dL (calc)
Non-HDL Cholesterol (Calc): 90 mg/dL (calc) (ref <130)
Total CHOL/HDL Ratio: 4 (calc) (ref <5.0)
Triglycerides: 142 mg/dL (ref <150)

## 2022-11-27 LAB — HIV-1 RNA QUANT-NO REFLEX-BLD
HIV 1 RNA Quant: <20 Copies/mL — ABNORMAL HIGH
HIV-1 RNA Quant, Log: <1.3 Log cps/mL — ABNORMAL HIGH

## 2022-11-27 LAB — RPR: RPR Ser Ql: NONREACTIVE

## 2022-12-02 ENCOUNTER — Other Ambulatory Visit (HOSPITAL_COMMUNITY): Payer: Self-pay

## 2022-12-02 ENCOUNTER — Other Ambulatory Visit: Payer: Self-pay

## 2022-12-07 ENCOUNTER — Other Ambulatory Visit: Payer: Self-pay

## 2023-01-04 ENCOUNTER — Other Ambulatory Visit (HOSPITAL_COMMUNITY): Payer: Self-pay

## 2023-01-05 ENCOUNTER — Other Ambulatory Visit (HOSPITAL_COMMUNITY): Payer: Self-pay

## 2023-01-06 ENCOUNTER — Other Ambulatory Visit (HOSPITAL_COMMUNITY): Payer: Self-pay

## 2023-01-28 ENCOUNTER — Other Ambulatory Visit: Payer: Self-pay

## 2023-02-02 ENCOUNTER — Other Ambulatory Visit (HOSPITAL_COMMUNITY): Payer: Self-pay

## 2023-02-25 ENCOUNTER — Other Ambulatory Visit (HOSPITAL_COMMUNITY): Payer: Self-pay

## 2023-03-01 ENCOUNTER — Other Ambulatory Visit (HOSPITAL_COMMUNITY): Payer: Self-pay

## 2023-03-30 ENCOUNTER — Other Ambulatory Visit (HOSPITAL_COMMUNITY): Payer: Self-pay

## 2023-04-04 ENCOUNTER — Other Ambulatory Visit (HOSPITAL_COMMUNITY): Payer: Self-pay

## 2023-04-28 ENCOUNTER — Other Ambulatory Visit (HOSPITAL_COMMUNITY): Payer: Self-pay

## 2023-05-26 ENCOUNTER — Other Ambulatory Visit (HOSPITAL_COMMUNITY): Payer: Self-pay

## 2023-05-27 ENCOUNTER — Other Ambulatory Visit: Payer: Self-pay

## 2023-05-27 NOTE — Progress Notes (Signed)
Specialty Pharmacy Refill Coordination Note  Micheal Morales is a 72 y.o. male contacted today regarding refills of specialty medication(s) Bictegravir-Emtricitab-Tenofov   Patient requested Delivery   Delivery date: 06/03/23   Verified address: [address] [place] [address]   Medication will be filled on 06/02/23.

## 2023-06-02 ENCOUNTER — Other Ambulatory Visit: Payer: Self-pay

## 2023-06-22 ENCOUNTER — Other Ambulatory Visit: Payer: Self-pay

## 2023-06-22 NOTE — Progress Notes (Signed)
Specialty Pharmacy Refill Coordination Note  Micheal Morales is a 72 y.o. male contacted today regarding refills of specialty medication(s) Bictegravir-Emtricitab-Tenofov   Patient requested Delivery   Delivery date: 06/30/23   Verified address: [address] [name] [address]   Medication will be filled on 06/29/23.

## 2023-07-18 ENCOUNTER — Other Ambulatory Visit: Payer: Self-pay

## 2023-07-20 ENCOUNTER — Other Ambulatory Visit: Payer: Self-pay

## 2023-07-20 NOTE — Progress Notes (Signed)
Specialty Pharmacy Refill Coordination Note  Micheal Morales is a 72 y.o. male contacted today regarding refills of specialty medication(s) Bictegravir-Emtricitab-Tenofov   Patient requested Delivery   Delivery date: 07/27/23   Verified address: [name] [place] [address]   Medication will be filled on 07/26/23.

## 2023-07-20 NOTE — Progress Notes (Signed)
Specialty Pharmacy Ongoing Clinical Assessment Note  Micheal Morales is a 72 y.o. male who is being followed by the specialty pharmacy service for RxSp HIV   Patient's specialty medication(s) reviewed today: Bictegravir-Emtricitab-Tenofov   Missed doses in the last 4 weeks: 0   Patient/Caregiver did not have any additional questions or concerns.   Therapeutic benefit summary: Patient is achieving benefit   Adverse events/side effects summary: No adverse events/side effects   Patient's therapy is appropriate to: Continue    Goals Addressed             This Visit's Progress    Achieve Undetectable HIV Viral Load < 20       Patient is on track. Patient will maintain adherence         Follow up:  6 months  Bobette Mo Specialty Pharmacist

## 2023-07-26 ENCOUNTER — Other Ambulatory Visit (HOSPITAL_COMMUNITY): Payer: Self-pay

## 2023-07-26 ENCOUNTER — Other Ambulatory Visit: Payer: Self-pay

## 2023-08-16 ENCOUNTER — Other Ambulatory Visit: Payer: Self-pay

## 2023-08-16 NOTE — Progress Notes (Signed)
Specialty Pharmacy Refill Coordination Note  Micheal Morales is a 72 y.o. male contacted today regarding refills of specialty medication(s) Bictegravir-Emtricitab-Tenofov [name])   Patient requested Delivery   Delivery date: 08/26/23   Verified address: [address] [place] [address]   Medication will be filled on 01.02.25.

## 2023-08-25 ENCOUNTER — Other Ambulatory Visit: Payer: Self-pay

## 2023-08-25 ENCOUNTER — Telehealth: Payer: Self-pay

## 2023-08-25 ENCOUNTER — Other Ambulatory Visit (HOSPITAL_COMMUNITY): Payer: Self-pay

## 2023-08-25 NOTE — Telephone Encounter (Signed)
 [facility] Patient Advocate Encounter   I was successful in securing patient a $5,000 grant from [employer] ([employer] to provide copayment coverage for Biktarvy .  This will make the out of pocket cost $0.00.     I have spoken with the patient.    The billing information is as follows and has been shared with [facility].        Patient knows to call the office with questions or concerns.  Arland Hutchinson, CPhT Specialty Pharmacy Patient [facility] for Infectious Disease Phone: [phone] Fax:  [phone]

## 2023-08-26 ENCOUNTER — Other Ambulatory Visit: Payer: Self-pay

## 2023-09-15 ENCOUNTER — Other Ambulatory Visit: Payer: Self-pay

## 2023-09-15 NOTE — Progress Notes (Signed)
Specialty Pharmacy Refill Coordination Note  Micheal Morales is a 73 y.o. male contacted today regarding refills of specialty medication(s) Bictegravir-Emtricitab-Tenofov [name])   Patient requested Delivery   Delivery date: 09/21/23   Verified address: [address]   [place] [address]   Medication will be filled on 09/20/23.

## 2023-09-20 ENCOUNTER — Other Ambulatory Visit: Payer: Self-pay

## 2023-10-19 ENCOUNTER — Other Ambulatory Visit: Payer: Self-pay

## 2023-10-19 NOTE — Progress Notes (Signed)
 Specialty Pharmacy Refill Coordination Note  Micheal Morales is a 73 y.o. male contacted today regarding refills of specialty medication(s) Bictegravir-Emtricitab-Tenofov [name])   Patient requested Delivery   Delivery date: 10/24/23   Verified address: [address]   [place] [address]   Medication will be filled on 10/21/23.

## 2023-10-21 ENCOUNTER — Other Ambulatory Visit: Payer: Self-pay

## 2023-11-16 ENCOUNTER — Other Ambulatory Visit: Payer: Self-pay | Admitting: Infectious Disease

## 2023-11-16 ENCOUNTER — Other Ambulatory Visit: Payer: Self-pay

## 2023-11-16 DIAGNOSIS — B20 Human immunodeficiency virus [HIV] disease: Secondary | ICD-10-CM

## 2023-11-16 DIAGNOSIS — Z79899 Other long term (current) drug therapy: Secondary | ICD-10-CM

## 2023-11-16 MED ORDER — BIKTARVY 50-200-25 MG PO TABS
1.0000 | ORAL_TABLET | Freq: Every day | ORAL | 0 refills | Status: DC
  Filled 2023-11-16: qty 30, 30d supply, fill #0

## 2023-11-16 NOTE — Progress Notes (Signed)
 Specialty Pharmacy Refill Coordination Note  Micheal Morales is a 73 y.o. male contacted today regarding refills of specialty medication(s) Bictegravir-Emtricitab-Tenofov [name])   Patient requested Delivery   Delivery date: 11/24/23   Verified address: [address]   [place] [address]   Medication will be filled on 11/23/23, pending refill approval.   This fill date is pending response to refill request from provider. Patient is aware and if they have not received fill by intended date they must follow up with pharmacy.

## 2023-11-21 ENCOUNTER — Ambulatory Visit: Payer: Medicare HMO | Admitting: Infectious Disease

## 2023-12-14 ENCOUNTER — Other Ambulatory Visit: Payer: Self-pay

## 2023-12-14 ENCOUNTER — Other Ambulatory Visit: Payer: Self-pay | Admitting: Infectious Disease

## 2023-12-14 DIAGNOSIS — B20 Human immunodeficiency virus [HIV] disease: Secondary | ICD-10-CM

## 2023-12-14 DIAGNOSIS — Z79899 Other long term (current) drug therapy: Secondary | ICD-10-CM

## 2023-12-14 NOTE — Progress Notes (Signed)
 Specialty Pharmacy Refill Coordination Note  Micheal Morales is a 73 y.o. male contacted today regarding refills of specialty medication(s) Bictegravir-Emtricitab-Tenofov (Biktarvy )   Patient requested Delivery   Delivery date: 12/22/23   Verified address: [address]   [place] [address]   Medication will be filled on 04.30.25.     This fill date is pending response to refill request from provider. Patient is aware and if they have not received fill by intended date they must follow up with pharmacy.

## 2023-12-15 ENCOUNTER — Other Ambulatory Visit (HOSPITAL_COMMUNITY): Payer: Self-pay

## 2023-12-15 ENCOUNTER — Other Ambulatory Visit: Payer: Self-pay

## 2023-12-15 NOTE — Progress Notes (Signed)
 Refill denied. Patient needs an appointment. LVM

## 2023-12-18 DIAGNOSIS — E7849 Other hyperlipidemia: Secondary | ICD-10-CM | POA: Insufficient documentation

## 2023-12-19 ENCOUNTER — Ambulatory Visit: Payer: Medicare HMO | Admitting: Infectious Disease

## 2023-12-19 ENCOUNTER — Other Ambulatory Visit: Payer: Self-pay

## 2023-12-19 ENCOUNTER — Other Ambulatory Visit (HOSPITAL_COMMUNITY)
Admission: RE | Admit: 2023-12-19 | Discharge: 2023-12-19 | Disposition: A | Discharge status: Home / Self Care | Source: Ambulatory Visit | Attending: Infectious Disease | Admitting: Infectious Disease

## 2023-12-19 VITALS — BP 150/86 | HR 82 | Ht 68.0 in | Wt 185.0 lb

## 2023-12-19 DIAGNOSIS — E1165 Type 2 diabetes mellitus with hyperglycemia: Secondary | ICD-10-CM

## 2023-12-19 DIAGNOSIS — Z79899 Other long term (current) drug therapy: Secondary | ICD-10-CM | POA: Insufficient documentation

## 2023-12-19 DIAGNOSIS — E7849 Other hyperlipidemia: Secondary | ICD-10-CM | POA: Diagnosis present

## 2023-12-19 DIAGNOSIS — IMO0001 Reserved for inherently not codable concepts without codable children: Secondary | ICD-10-CM

## 2023-12-19 DIAGNOSIS — E119 Type 2 diabetes mellitus without complications: Secondary | ICD-10-CM

## 2023-12-19 DIAGNOSIS — I10 Essential (primary) hypertension: Secondary | ICD-10-CM | POA: Diagnosis not present

## 2023-12-19 DIAGNOSIS — B20 Human immunodeficiency virus [HIV] disease: Secondary | ICD-10-CM | POA: Insufficient documentation

## 2023-12-19 DIAGNOSIS — E785 Hyperlipidemia, unspecified: Secondary | ICD-10-CM | POA: Diagnosis not present

## 2023-12-19 DIAGNOSIS — Z7185 Encounter for immunization safety counseling: Secondary | ICD-10-CM

## 2023-12-19 DIAGNOSIS — Z794 Long term (current) use of insulin: Secondary | ICD-10-CM

## 2023-12-19 MED ORDER — BIKTARVY 50-200-25 MG PO TABS
1.0000 | ORAL_TABLET | Freq: Every day | ORAL | 11 refills | Status: AC
  Filled 2023-12-19: qty 30, 30d supply, fill #0
  Filled 2024-01-18: qty 30, 30d supply, fill #1
  Filled 2024-02-20: qty 30, 30d supply, fill #2
  Filled 2024-03-23: qty 30, 30d supply, fill #3
  Filled 2024-04-27: qty 30, 30d supply, fill #4
  Filled 2024-05-25 – 2024-05-28 (×2): qty 30, 30d supply, fill #5
  Filled 2024-06-22: qty 30, 30d supply, fill #6
  Filled 2024-07-25: qty 30, 30d supply, fill #7
  Filled 2024-08-24 – 2024-09-04 (×6): qty 30, 30d supply, fill #8
  Filled 2024-09-28: qty 30, 30d supply, fill #9

## 2023-12-19 NOTE — Progress Notes (Signed)
 Subjective:  Chief complaint: follow-up for HIV disease on medications   Patient ID: Micheal Morales, male    DOB: [birth year], 73 y.o.   MRN: [id number]  HPI  Discussed the use of AI scribe software for clinical note transcription with the patient, who gave verbal consent to proceed.  History of Present Illness   Micheal Morales is a patient,\with a history of HIV, diabetes, hypertension, and hyperlipidemia, presents for a routine follow-up and medication refill. He reports good adherence to his current regimen, including Biktarvy  for HIV, Novolog  FlexPen and a long-acting insulin  for diabetes, losartan  and verapamil for hypertension, and rosuvastatin  for hyperlipidemia. He has not experienced any significant side effects or complications from these medications.  His diabetes is managed with insulin  injections, which have been successful in controlling his blood sugar levels. He has a G7 sensor that sends readings to his phone. His most recent A1c was 7.8, and he is striving to lower it further.  The patient also mentions a past history of COVID-19 infection and has chosen not to receive the COVID-19 vaccine. He has received all other recommended vaccines, including flu, pneumonia, and shingles.      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  No family history on file.    Social History   Socioeconomic History   Marital status: Married    Spouse name: Not on file   Number of children: Not on file   Years of education: Not on file   Highest education level: Not on file  Occupational History   Not on file  Tobacco Use   Smoking status: Never   Smokeless tobacco: Never  Substance and Sexual Activity   Alcohol use: Yes    Alcohol/week: 1.0 standard drink of alcohol    Types: 1 Glasses of wine per week    Comment: occ   Drug use: No   Sexual activity: Not Currently  Other Topics Concern   Not on file  Social History Narrative   Not on file   Social Drivers of Health   Financial Resource Strain: Low Risk  (08/26/2021)   Received from [facility], [facility]   Overall Financial Resource Strain (CARDIA)    Difficulty of Paying Living Expenses: Not hard at all  Food Insecurity: Low Risk  (03/09/2023)   Received from [facility]   Hunger Vital Sign    Worried About Running Out of Food in the Last Year: Never true    Ran Out of Food in the Last Year: Never true  Transportation Needs: Not on file (03/09/2023)  Physical Activity: Not on file  Stress: No Stress Concern Present (08/26/2021)   Received from [facility], [facility]   [employer] [facility] [employer] [facility] - Occupational Stress Questionnaire    Feeling  of Stress : Not at all  Social Connections: Unknown (12/21/2021)   Received from [facility], [facility]   Social Network    Social Network: Not on file    Allergies  Allergen Reactions   Lisinopril Cough   Cefaclor     REACTION: rash   Codeine Other (See Comments)    Reaction not specified     Current Outpatient Medications:    bictegravir-emtricitabine -tenofovir  AF (BIKTARVY ) 50-200-25 MG TABS tablet, TAKE 1 TABLET BY MOUTH DAILY., Disp: 30 tablet, Rfl: 11   blood glucose meter kit and supplies KIT, Dispense based on patient and insurance preference. Use up to four times  daily as directed. (FOR ICD-9 250.00, 250.01)., Disp: 1 each, Rfl: 0   levothyroxine  (SYNTHROID , LEVOTHROID) 50 MCG tablet, Take 50 mcg by mouth daily., Disp: , Rfl: 0   losartan  (COZAAR ) 25 MG tablet, Take 25 mg by mouth daily., Disp: , Rfl:    losartan  (COZAAR ) 50 MG tablet, Take by mouth., Disp: , Rfl:    meclizine  (ANTIVERT ) 25 MG tablet, Take 25 mg by mouth 4 (four) times daily., Disp: , Rfl:    MICROLET LANCETS MISC, , Disp: , Rfl: 0   NOVOLOG  FLEXPEN 100 UNIT/ML FlexPen, Inject into the skin., Disp: , Rfl:    rosuvastatin  (CRESTOR ) 5 MG tablet, Take 20 mg by mouth daily., Disp: , Rfl:    clonazePAM  (KLONOPIN ) 0.5 MG tablet, Take 1 tablet (0.5 mg total) by mouth 2 (two) times daily. (Patient not taking: Reported on 12/19/2023), Disp: 20 tablet, Rfl: 0   clotrimazole -betamethasone  (LOTRISONE ) cream, Apply topically. (Patient not taking: Reported on 12/19/2023), Disp: , Rfl:    diclofenac  (VOLTAREN ) 75 MG EC tablet, Take 75 mg by mouth 2 (two) times daily. (Patient not taking: Reported on 12/19/2023), Disp: , Rfl:    DULoxetine  (CYMBALTA ) 60 MG capsule, , Disp: , Rfl: 0   fluconazole  (DIFLUCAN ) 100 MG tablet, Take 1 tablet (100 mg total) by mouth daily. (Patient not taking: Reported on 12/19/2023), Disp: 14 tablet, Rfl: 1   fluorouracil (EFUDEX) 5 % cream, apply topically to affected area twice a day for 2 weeks (EXPECT IRRITATION). (Patient not taking: Reported on 12/19/2023), Disp: , Rfl: 0   JARDIANCE 25 MG TABS tablet, Take 25 mg by mouth daily. (Patient not taking: Reported on 12/19/2023), Disp: , Rfl:    LANTUS  SOLOSTAR 100 UNIT/ML Solostar Pen, Inject into the skin. (Patient not taking: Reported on 12/19/2023), Disp: , Rfl:    pregabalin  (LYRICA ) 200 MG capsule, Take 200 mg by mouth 2 (two) times daily. (Patient not taking: Reported on 12/19/2023), Disp: , Rfl:    verapamil (CALAN-SR) 120 MG CR tablet, Take 120 mg by mouth daily. 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  No family history on file.    Social History   Socioeconomic History   Marital status: Married    Spouse name: Not on file   Number of children: Not on file   Years of education: Not on file   Highest education level: Not on file  Occupational History   Not on file  Tobacco Use   Smoking status: Never   Smokeless tobacco: Never  Substance and Sexual Activity   Alcohol use: Yes    Alcohol/week: 1.0 standard drink of alcohol    Types: 1 Glasses of wine per week    Comment: occ   Drug use: No   Sexual activity: Not Currently  Other Topics Concern   Not on file  Social History Narrative   Not on file   Social Drivers of Health   Financial Resource Strain: Low Risk  (08/26/2021)   Received from [facility], [facility]   Overall Financial Resource Strain (CARDIA)    Difficulty of Paying Living Expenses: Not hard at all  Food Insecurity: Low Risk  (03/09/2023)   Received from [facility]   Hunger Vital Sign    Worried About Running Out of Food in the Last Year: Never true    Ran Out of Food in the Last Year: Never true  Transportation Needs: Not on file (03/09/2023)  Physical Activity: Not on file  Stress: No Stress Concern Present (08/26/2021)   Received  from [facility], [facility]   [employer] of Occupational Health - Occupational Stress Questionnaire    Feeling of Stress : Not at all  Social Connections: Unknown (12/21/2021)   Received from [facility], [facility]   Social Network    Social Network: Not on file    Allergies  Allergen Reactions   Lisinopril Cough   Cefaclor     REACTION: rash   Codeine Other (See Comments)    Reaction not specified     Current Outpatient Medications:    levothyroxine  (SYNTHROID , LEVOTHROID) 50 MCG tablet, Take 50 mcg by mouth daily., Disp: , Rfl: 0   losartan  (COZAAR ) 25 MG tablet, Take 25 mg by mouth daily., Disp: , Rfl:    losartan  (COZAAR ) 50 MG tablet, Take by mouth., Disp: , Rfl:    meclizine  (ANTIVERT ) 25 MG tablet, Take 25 mg by mouth 4 (four) times daily., Disp: , Rfl:    MICROLET LANCETS MISC, , Disp: , Rfl: 0   NOVOLOG  FLEXPEN 100 UNIT/ML FlexPen, Inject into the skin., Disp: , Rfl:    rosuvastatin  (CRESTOR ) 5 MG tablet, Take 20 mg by mouth daily., Disp: , Rfl:    bictegravir-emtricitabine -tenofovir  AF (BIKTARVY ) 50-200-25 MG TABS tablet, TAKE 1 TABLET BY MOUTH DAILY., Disp: 30 tablet, Rfl: 11   blood glucose meter kit and supplies KIT, Dispense based on patient and insurance preference. Use up to four times daily as directed. (FOR ICD-9 250.00, 250.01)., Disp: 1 each, Rfl: 0   clonazePAM  (KLONOPIN ) 0.5 MG tablet, Take 1 tablet (0.5 mg total) by mouth 2 (two) times daily. (Patient not taking: Reported on 12/19/2023), Disp: 20 tablet, Rfl: 0   clotrimazole -betamethasone  (LOTRISONE ) cream, Apply topically., Disp: , Rfl:    diclofenac  (VOLTAREN ) 75 MG EC tablet, Take 75 mg by mouth 2 (two) times daily. (Patient not taking: Reported on 12/19/2023), Disp: , Rfl:    DULoxetine  (CYMBALTA ) 60 MG capsule, , Disp: , Rfl: 0   fluconazole  (DIFLUCAN ) 100 MG tablet, Take 1 tablet (100  mg total) by mouth daily. (Patient not taking: Reported on 12/19/2023), Disp: 14 tablet, Rfl: 1    fluorouracil (EFUDEX) 5 % cream, apply topically to affected area twice a day for 2 weeks (EXPECT IRRITATION)., Disp: , Rfl: 0   JARDIANCE 25 MG TABS tablet, Take 25 mg by mouth daily., Disp: , Rfl:    LANTUS  SOLOSTAR 100 UNIT/ML Solostar Pen, Inject into the skin. (Patient not taking: Reported on 12/19/2023), Disp: , Rfl:    pregabalin  (LYRICA ) 200 MG capsule, Take 200 mg by mouth 2 (two) times daily. (Patient not taking: Reported on 12/19/2023), Disp: , Rfl:    verapamil (CALAN-SR) 120 MG CR tablet, Take 120 mg by mouth daily. (Patient not taking: Reported on 12/19/2023), Disp: , Rfl:    Review of Systems  Constitutional:  Negative for activity change, appetite change, chills, diaphoresis, fatigue, fever and unexpected weight change.  HENT:  Negative for congestion, rhinorrhea, sinus pressure, sneezing, sore throat and trouble swallowing.   Eyes:  Negative for photophobia and visual disturbance.  Respiratory:  Negative for cough, chest tightness, shortness of breath, wheezing and stridor.   Cardiovascular:  Negative for chest pain, palpitations and leg swelling.  Gastrointestinal:  Negative for abdominal distention, abdominal pain, anal bleeding, blood in stool, constipation, diarrhea, nausea and vomiting.  Genitourinary:  Negative for difficulty urinating, dysuria, flank pain and hematuria.  Musculoskeletal:  Negative for arthralgias, back pain, gait problem, joint swelling and myalgias.  Skin:  Negative for color change, pallor, rash and wound.  Neurological:  Negative for dizziness, tremors, weakness and light-headedness.  Hematological:  Negative for adenopathy. Does not bruise/bleed easily.  Psychiatric/Behavioral:  Negative for agitation, behavioral problems, confusion, decreased concentration, dysphoric mood and sleep disturbance.        Objective:   Physical Exam Constitutional:      Appearance: He is well-developed.  HENT:     Head: Normocephalic and atraumatic.  Eyes:      Conjunctiva/sclera: Conjunctivae normal.  Cardiovascular:     Rate and Rhythm: Normal rate and regular rhythm.  Pulmonary:     Effort: Pulmonary effort is normal. No respiratory distress.     Breath sounds: No wheezing.  Abdominal:     General: There is no distension.     Palpations: Abdomen is soft.  Musculoskeletal:        General: No tenderness. Normal range of motion.     Cervical back: Normal range of motion and neck supple.  Skin:    General: Skin is warm and dry.     Coloration: Skin is not pale.     Findings: No erythema or rash.  Neurological:     General: No focal deficit present.     Mental Status: He is alert and oriented to person, place, and time.  Psychiatric:        Mood and Affect: Mood normal.        Behavior: Behavior normal.        Thought Content: Thought content normal.        Judgment: Judgment normal.           Assessment & Plan:   Assessment and Plan    HIV management Regular monitoring of viral load and CD4 count is essential. Follow-up every ten months aligns with [employer] funding guidelines. - Order labs for viral load and CD4 count. - Schedule follow-up in ten months. - Ensure Biktarvy  prescription is sent to pharmacy.  Type 2 diabetes mellitus with insulin  use A1c of 7.8  shows suboptimal control but improvement. Insulin  therapy continues as effective. Weight loss medications discussed by me but would defer to PCP - Continue Novolog  FlexPen and long-acting insulin  as prescribed.  Hypertension Managed with losartan  and verapamil.  Hyperlipidemia Managed with rosuvastatin .  Vaccination status Received flu, pneumonia, shingles, and booster vaccines. Declined COVID-19 vaccine despite recommendations. - Discuss COVID-19 vaccination at future visits.

## 2023-12-19 NOTE — Progress Notes (Signed)
 Patient has been notified that refill has been approved.

## 2023-12-20 LAB — URINE CYTOLOGY ANCILLARY ONLY
Chlamydia: NEGATIVE
Comment: NEGATIVE
Comment: NORMAL
Neisseria Gonorrhea: NEGATIVE

## 2023-12-20 LAB — T-HELPER CELLS (CD4) COUNT (NOT AT ARMC)
CD4 % Helper T Cell: 54 % (ref 33–65)
CD4 T Cell Abs: 549 /uL (ref 400–1790)

## 2023-12-21 ENCOUNTER — Other Ambulatory Visit: Payer: Self-pay

## 2023-12-21 LAB — CBC WITH DIFFERENTIAL/PLATELET
Absolute Lymphocytes: 1090 {cells}/uL (ref 850–3900)
Absolute Monocytes: 355 {cells}/uL (ref 200–950)
Basophils Absolute: 53 {cells}/uL (ref 0–200)
Basophils Relative: 1.1 %
Eosinophils Absolute: 130 {cells}/uL (ref 15–500)
Eosinophils Relative: 2.7 %
HCT: 46.4 % (ref 38.5–50.0)
Hemoglobin: 15.4 g/dL (ref 13.2–17.1)
MCH: 31.8 pg (ref 27.0–33.0)
MCHC: 33.2 g/dL (ref 32.0–36.0)
MCV: 95.9 fL (ref 80.0–100.0)
MPV: 11.8 fL (ref 7.5–12.5)
Monocytes Relative: 7.4 %
Neutro Abs: 3173 {cells}/uL (ref 1500–7800)
Neutrophils Relative %: 66.1 %
Platelets: 132 10*3/uL — ABNORMAL LOW (ref 140–400)
RBC: 4.84 10*6/uL (ref 4.20–5.80)
RDW: 12.4 % (ref 11.0–15.0)
Total Lymphocyte: 22.7 %
WBC: 4.8 10*3/uL (ref 3.8–10.8)

## 2023-12-21 LAB — HIV-1 RNA QUANT-NO REFLEX-BLD
HIV 1 RNA Quant: 54 {copies}/mL — ABNORMAL HIGH
HIV-1 RNA Quant, Log: 1.73 {Log_copies}/mL — ABNORMAL HIGH

## 2023-12-21 LAB — COMPLETE METABOLIC PANEL WITHOUT GFR
AG Ratio: 2 (calc) (ref 1.0–2.5)
ALT: 27 U/L (ref 9–46)
AST: 16 U/L (ref 10–35)
Albumin: 4.7 g/dL (ref 3.6–5.1)
Alkaline phosphatase (APISO): 61 U/L (ref 35–144)
BUN: 21 mg/dL (ref 7–25)
CO2: 27 mmol/L (ref 20–32)
Calcium: 9.6 mg/dL (ref 8.6–10.3)
Chloride: 103 mmol/L (ref 98–110)
Creat: 1.06 mg/dL (ref 0.70–1.28)
Globulin: 2.4 g/dL (ref 1.9–3.7)
Glucose, Bld: 308 mg/dL — ABNORMAL HIGH (ref 65–99)
Potassium: 4.7 mmol/L (ref 3.5–5.3)
Sodium: 137 mmol/L (ref 135–146)
Total Bilirubin: 0.5 mg/dL (ref 0.2–1.2)
Total Protein: 7.1 g/dL (ref 6.1–8.1)

## 2023-12-21 LAB — RPR: RPR Ser Ql: NONREACTIVE

## 2024-01-10 ENCOUNTER — Other Ambulatory Visit: Payer: Self-pay

## 2024-01-11 ENCOUNTER — Other Ambulatory Visit: Payer: Self-pay

## 2024-01-11 NOTE — Progress Notes (Signed)
 The 10-year ASCVD risk score (Arnett DK, et al., 2019) is: 49.6%   Values used to calculate the score:     Age: 73 years     Sex: Male     Is Non-Hispanic African American: No     Diabetic: Yes     Tobacco smoker: No     Systolic Blood Pressure: 150 mmHg     Is BP treated: Yes     HDL Cholesterol: 30 mg/dL     Total Cholesterol: 142 mg/dL  Currently prescribed rosuvastatin  5 mg.  Kieron Kantner, BSN, RN

## 2024-01-18 ENCOUNTER — Other Ambulatory Visit: Payer: Self-pay

## 2024-01-18 NOTE — Progress Notes (Signed)
 Specialty Pharmacy Ongoing Clinical Assessment Note  Micheal Morales is a 73 y.o. male who is being followed by the specialty pharmacy service for RxSp HIV   Patient's specialty medication(s) reviewed today: Bictegravir-Emtricitab-Tenofov (Biktarvy )   Missed doses in the last 4 weeks: 0   Patient/Caregiver did not have any additional questions or concerns.   Therapeutic benefit summary: Patient is achieving benefit   Adverse events/side effects summary: No adverse events/side effects   Patient's therapy is appropriate to: Continue    Goals Addressed             This Visit's Progress    Achieve Undetectable HIV Viral Load < 20   On track    Patient is on track. Patient will maintain adherence. Patient had been undetecable long term, last viral load on 12/19/23 was 54 copies/mL         Follow up: 6 months  [facility]

## 2024-01-18 NOTE — Progress Notes (Signed)
 Specialty Pharmacy Refill Coordination Note  Micheal Morales is a 73 y.o. male contacted today regarding refills of specialty medication(s) Bictegravir-Emtricitab-Tenofov (Biktarvy )   Patient requested Delivery   Delivery date: 01/23/24   Verified address: [address]   [place] [address]   Medication will be filled on 01/20/24.

## 2024-01-26 ENCOUNTER — Other Ambulatory Visit: Payer: Self-pay

## 2024-02-20 ENCOUNTER — Other Ambulatory Visit: Payer: Self-pay

## 2024-02-20 NOTE — Progress Notes (Signed)
 Specialty Pharmacy Refill Coordination Note  Micheal Morales is a 73 y.o. male contacted today regarding refills of specialty medication(s) Bictegravir-Emtricitab-Tenofov (Biktarvy )   Patient requested Delivery   Delivery date: 02/28/24   Verified address: [address]   [place]  [address]   Medication will be filled on 07.07.25.

## 2024-03-23 ENCOUNTER — Other Ambulatory Visit: Payer: Self-pay

## 2024-03-26 ENCOUNTER — Other Ambulatory Visit: Payer: Self-pay

## 2024-03-26 NOTE — Progress Notes (Signed)
 Specialty Pharmacy Refill Coordination Note  Micheal Morales is a 73 y.o. male contacted today regarding refills of specialty medication(s) Bictegravir-Emtricitab-Tenofov (Biktarvy )   Patient requested Delivery   Delivery date: 03/28/24   Verified address: [address]   [place] [address]   Medication will be filled on 03/27/24.

## 2024-04-19 ENCOUNTER — Other Ambulatory Visit (HOSPITAL_COMMUNITY): Payer: Self-pay

## 2024-04-27 ENCOUNTER — Other Ambulatory Visit: Payer: Self-pay

## 2024-04-27 ENCOUNTER — Other Ambulatory Visit: Payer: Self-pay | Admitting: Pharmacy Technician

## 2024-04-27 NOTE — Progress Notes (Signed)
 Specialty Pharmacy Refill Coordination Note  Micheal Morales is a 73 y.o. male contacted today regarding refills of specialty medication(s) Bictegravir-Emtricitab-Tenofov (Biktarvy )   Patient requested Delivery   Delivery date: 05/04/24   Verified address: [address]  [place] [address]   Medication will be filled on 05/03/24.

## 2024-05-25 ENCOUNTER — Other Ambulatory Visit: Payer: Self-pay

## 2024-05-28 ENCOUNTER — Other Ambulatory Visit: Payer: Self-pay

## 2024-05-28 ENCOUNTER — Other Ambulatory Visit: Payer: Self-pay | Admitting: Pharmacy Technician

## 2024-05-28 NOTE — Progress Notes (Signed)
 Specialty Pharmacy Refill Coordination Note  Micheal Morales is a 73 y.o. male contacted today regarding refills of specialty medication(s) Bictegravir-Emtricitab-Tenofov (Biktarvy )   Patient requested Delivery   Delivery date: 06/01/24   Verified address: [address] [place]    Medication will be filled on 05/31/24.

## 2024-05-30 ENCOUNTER — Other Ambulatory Visit: Payer: Self-pay

## 2024-06-19 LAB — COLOGUARD: COLOGUARD: NEGATIVE

## 2024-06-22 ENCOUNTER — Other Ambulatory Visit: Payer: Self-pay

## 2024-06-22 NOTE — Progress Notes (Signed)
 Specialty Pharmacy Refill Coordination Note  Micheal Morales is a 73 y.o. male contacted today regarding refills of specialty medication(s) Bictegravir-Emtricitab-Tenofov (Biktarvy )   Patient requested Delivery   Delivery date: 06/29/24   Verified address: [address] [place]   Medication will be filled on: 06/28/24

## 2024-06-28 ENCOUNTER — Other Ambulatory Visit: Payer: Self-pay

## 2024-07-06 ENCOUNTER — Other Ambulatory Visit: Payer: Self-pay

## 2024-07-25 ENCOUNTER — Other Ambulatory Visit: Payer: Self-pay

## 2024-07-25 NOTE — Progress Notes (Signed)
 Specialty Pharmacy Refill Coordination Note  Micheal Morales is a 73 y.o. male contacted today regarding refills of specialty medication(s) Bictegravir-Emtricitab-Tenofov (Biktarvy )   Patient requested Delivery   Delivery date: 08/03/24   Verified address: [address] [place]   Medication will be filled on: 08/02/24

## 2024-08-02 ENCOUNTER — Other Ambulatory Visit: Payer: Self-pay

## 2024-08-24 ENCOUNTER — Other Ambulatory Visit (HOSPITAL_COMMUNITY): Payer: Self-pay

## 2024-08-27 ENCOUNTER — Other Ambulatory Visit: Payer: Self-pay

## 2024-08-29 ENCOUNTER — Other Ambulatory Visit: Payer: Self-pay

## 2024-08-30 ENCOUNTER — Other Ambulatory Visit: Payer: Self-pay

## 2024-08-30 ENCOUNTER — Other Ambulatory Visit (HOSPITAL_COMMUNITY): Payer: Self-pay

## 2024-08-31 ENCOUNTER — Other Ambulatory Visit: Payer: Self-pay

## 2024-09-03 ENCOUNTER — Other Ambulatory Visit: Payer: Self-pay

## 2024-09-04 ENCOUNTER — Other Ambulatory Visit: Payer: Self-pay

## 2024-09-04 ENCOUNTER — Other Ambulatory Visit (HOSPITAL_COMMUNITY): Payer: Self-pay

## 2024-09-04 ENCOUNTER — Telehealth: Payer: Self-pay

## 2024-09-04 ENCOUNTER — Other Ambulatory Visit: Payer: Self-pay | Admitting: Pharmacy Technician

## 2024-09-04 NOTE — Telephone Encounter (Addendum)
 [facility] Patient Advocate Encounter   I was successful in securing patient a $ 2100 grant from [employer] to provide copayment coverage for BIKTARVY .  The patient's out of pocket cost will be $5 monthly.     I have spoken with the patient.    The billing information is as follows and has been shared with Darryle Law.   Member ID: [id number] Group ID: [id number] RxBin: [id number] Dates of Eligibility: 08/30/24 through 08/22/25     Patient knows to call the office with questions or concerns.  Charmaine Sharps, CPhT Specialty Pharmacy Patient [facility] Phone: [phone] Fax:  [phone]

## 2024-09-04 NOTE — Progress Notes (Signed)
 Specialty Pharmacy Refill Coordination Note  Micheal Morales is a 74 y.o. male contacted today regarding refills of specialty medication(s) Bictegravir-Emtricitab-Tenofov (Biktarvy )   Patient requested Delivery   Delivery date: 09/07/24   Verified address: [address]  [place]   Medication will be filled on: 09/06/24

## 2024-09-06 ENCOUNTER — Other Ambulatory Visit: Payer: Self-pay

## 2024-09-28 ENCOUNTER — Other Ambulatory Visit (HOSPITAL_COMMUNITY): Payer: Self-pay

## 2024-09-28 NOTE — Progress Notes (Signed)
 Specialty Pharmacy Refill Coordination Note  Micheal Morales is a 74 y.o. male contacted today regarding refills of specialty medication(s) Bictegravir-Emtricitab-Tenofov (Biktarvy )   Patient requested Delivery   Delivery date: 10/05/24   Verified address: [address]  [place]   Medication will be filled on: 10/04/24

## 2024-10-08 ENCOUNTER — Ambulatory Visit: Admitting: Infectious Disease
